# Patient Record
Sex: Male | Born: 1985 | Race: Black or African American | Hispanic: No | Marital: Married | Smoking: Former smoker
Health system: Southern US, Community
[De-identification: ages and names within clinical notes are randomized; demographics above are authoritative.]

## PROBLEM LIST (undated history)

## (undated) ENCOUNTER — Emergency Department (HOSPITAL_COMMUNITY): Admission: EM | Payer: Self-pay | Source: Home / Self Care

---

## 2012-11-20 ENCOUNTER — Emergency Department (HOSPITAL_COMMUNITY): Payer: Self-pay

## 2012-11-20 ENCOUNTER — Emergency Department (HOSPITAL_COMMUNITY)
Admission: EM | Admit: 2012-11-20 | Discharge: 2012-11-20 | Disposition: A | Discharge status: Home / Self Care | Payer: Self-pay | Attending: Emergency Medicine | Admitting: Emergency Medicine

## 2012-11-20 ENCOUNTER — Encounter (HOSPITAL_COMMUNITY): Payer: Self-pay | Admitting: Emergency Medicine

## 2012-11-20 DIAGNOSIS — M25561 Pain in right knee: Secondary | ICD-10-CM

## 2012-11-20 DIAGNOSIS — M25569 Pain in unspecified knee: Secondary | ICD-10-CM | POA: Insufficient documentation

## 2012-11-20 MED ORDER — TRAMADOL HCL 50 MG PO TABS: 50.0000 mg | ORAL_TABLET | Freq: Four times a day (QID) | ORAL | Status: DC | PRN

## 2012-11-20 NOTE — Progress Notes (Signed)
Orthopedic Tech Progress Note Patient Details:  Carl Davila October 06, 1985 [id number] Knee Immobilizer applied to Right LE. Crutches fit for height and comfort.  Ortho Devices Type of Ortho Device: Knee Immobilizer;Crutches Ortho Device/Splint Location: Right LE Ortho Device/Splint Interventions: Application   Asia R Thompson 11/20/2012, 11:02 AM

## 2012-11-20 NOTE — ED Provider Notes (Signed)
CSN: [id number]     Arrival date & time 11/20/12  4098 History   First MD Initiated Contact with Patient 11/20/12 [phone]     Chief Complaint  Patient presents with  . Knee Pain   (Consider location/radiation/quality/duration/timing/severity/associated sxs/prior Treatment) HPI Comments: Pt states that he has been having right knee pain intermittently but today it seems worse:pt state that he has not had swelling redness or warmth to the area:pt states that he took some tylenol with mild relief:pt states that the area doesn't want to bend completely:pt states that he feel on it this morning because of the pain  The history is provided by the patient. No language interpreter was used.    History reviewed. No pertinent past medical history. History reviewed. No pertinent past surgical history. History reviewed. No pertinent family history. History  Substance Use Topics  . Smoking status: Never Smoker   . Smokeless tobacco: Not on file  . Alcohol Use: No    Review of Systems  Constitutional: Negative.   Respiratory: Negative.   Cardiovascular: Negative.     Allergies  Review of patient's allergies indicates no known allergies.  Home Medications  No current outpatient prescriptions on file. BP 115/64  Pulse 62  Temp(Src) 98.2 F (36.8 C) (Oral)  Resp 18  SpO2 98% Physical Exam  Nursing note and vitals reviewed. Constitutional: He is oriented to person, place, and time. He appears well-developed and well-nourished.  Cardiovascular: Normal rate and regular rhythm.   Pulmonary/Chest: Effort normal.  Musculoskeletal: Normal range of motion.  No gross deformity or swelling noted:no redness or warmth:has full YNW:GNFAOZ to the  Lateral aspect  Neurological: He is alert and oriented to person, place, and time.  Skin: Skin is warm and dry.    ED Course  Procedures (including critical care time) Labs Review Labs Reviewed - No data to display Imaging Review Dg Knee Complete 4  Views Right  11/20/2012   CLINICAL DATA:  Right knee pain  EXAM: RIGHT KNEE - COMPLETE 4+ VIEW  COMPARISON:  None  FINDINGS: There is a large osteochondral defect involving the lateral femoral condyle. MRI of the right knee may be helpful to evaluate further. The joint spaces are relatively well-preserved. No acute fracture is seen and no joint effusion is noted. The patella appears normally positioned.  IMPRESSION: Large osteochondral defect involving the right lateral femoral condyle. Consider MRI to assess further.   Electronically Signed   By: Dwyane Dee M.D.   On: 11/20/2012 09:42    MDM   1. Knee pain, right    Discussed findings with pt:pt given ortho follow up:pt placed in immobilizer for comfort    Teressa Lower, NP 11/20/12 1016

## 2012-11-20 NOTE — ED Notes (Signed)
NP in room

## 2012-11-20 NOTE — ED Notes (Signed)
Spoke with Ortho will see patient shortly.

## 2012-11-20 NOTE — ED Notes (Addendum)
Pt states he has had right knee pain off an on since he was 76 or 27 years old.  Today he was walking to work and was unable to continue, and states that he hs pain in his knee in the front and back sides. Pain is worse when he extends his knee but at rest it is still 8/10.  Denies recent injuries.

## 2012-11-20 NOTE — ED Notes (Signed)
Paged Ortho  

## 2012-11-20 NOTE — ED Notes (Signed)
Pt c/o right knee pain from walking to work recently; pt denies obvious injury but sts some chronic pain

## 2012-11-20 NOTE — ED Provider Notes (Signed)
Medical screening examination/treatment/procedure(s) were performed by non-physician practitioner and as supervising physician I was immediately available for consultation/collaboration.    Celene Kras, MD 11/20/12 [phone]

## 2013-04-01 ENCOUNTER — Emergency Department (HOSPITAL_COMMUNITY)
Admission: EM | Admit: 2013-04-01 | Discharge: 2013-04-01 | Disposition: A | Discharge status: Home / Self Care | Payer: Self-pay | Attending: Emergency Medicine | Admitting: Emergency Medicine

## 2013-04-01 ENCOUNTER — Encounter (HOSPITAL_COMMUNITY): Payer: Self-pay | Admitting: Emergency Medicine

## 2013-04-01 ENCOUNTER — Emergency Department (HOSPITAL_COMMUNITY): Payer: Self-pay

## 2013-04-01 DIAGNOSIS — B349 Viral infection, unspecified: Secondary | ICD-10-CM

## 2013-04-01 DIAGNOSIS — B9789 Other viral agents as the cause of diseases classified elsewhere: Secondary | ICD-10-CM | POA: Insufficient documentation

## 2013-04-01 MED ORDER — DIPHENHYDRAMINE HCL 25 MG PO TABS: 25.0000 mg | ORAL_TABLET | Freq: Four times a day (QID) | ORAL | Status: DC | PRN

## 2013-04-01 MED ORDER — ACETAMINOPHEN 500 MG PO TABS
500.0000 mg | ORAL_TABLET | Freq: Four times a day (QID) | ORAL | Status: DC | PRN
Start: 2013-04-01 — End: 2013-05-17

## 2013-04-01 NOTE — Discharge Instructions (Signed)
Take tylenol as needed for body aches. Take benadryl as needed for congestion. Refer to attached documents for more information.

## 2013-04-01 NOTE — ED Provider Notes (Signed)
CSN: [id number]     Arrival date & time 04/01/13  1318 History  This chart was scribed for non-physician practitioner, Emilia Beck, PA-C working with Gavin Pound. Oletta Lamas, MD by Greggory Stallion, ED scribe. This patient was seen in room TR06C/TR06C and the patient's care was started at 3:12 PM.   Chief Complaint  Patient presents with  . Influenza   The history is provided by the patient. No language interpreter was used.   HPI Comments: Carl Davila is a 28 y.o. male who presents to the Emergency Department complaining of generalized body aches, rhinorrhea, headache and fever that started yesterday. He also has mild cough. Pt has taken an OTC sinus medication with no relief. Denies sore throat.   History reviewed. No pertinent past medical history. History reviewed. No pertinent past surgical history. No family history on file. History  Substance Use Topics  . Smoking status: Never Smoker   . Smokeless tobacco: Not on file  . Alcohol Use: No    Review of Systems  Constitutional: Positive for fever.  HENT: Positive for rhinorrhea. Negative for sore throat.   Respiratory: Positive for cough.   Musculoskeletal: Positive for myalgias.  Neurological: Positive for headaches.  All other systems reviewed and are negative.   Allergies  Review of patient's allergies indicates no known allergies.  Home Medications   Current Outpatient Rx  Name  Route  Sig  Dispense  Refill  . acetaminophen (TYLENOL) 500 MG tablet   Oral   Take 1,000 mg by mouth every 6 (six) hours as needed for mild pain.         Marland Kitchen OVER THE COUNTER MEDICATION   Oral   Take 1 tablet by mouth daily as needed (otc allergy relief).          BP 120/81  Pulse 67  Temp(Src) 97.6 F (36.4 C) (Oral)  Resp 18  SpO2 100%  Physical Exam  Nursing note and vitals reviewed. Constitutional: He is oriented to person, place, and time. He appears well-developed and well-nourished. No distress.  HENT:  Head:  Normocephalic and atraumatic.  Mouth/Throat: Oropharynx is clear and moist and mucous membranes are normal.  Eyes: EOM are normal.  Neck: Neck supple. No tracheal deviation present.  Cardiovascular: Normal rate, regular rhythm and normal heart sounds.   Pulmonary/Chest: Effort normal and breath sounds normal. No respiratory distress. He has no wheezes. He has no rales.  Musculoskeletal: Normal range of motion.  Neurological: He is alert and oriented to person, place, and time.  Skin: Skin is warm and dry.  Psychiatric: He has a normal mood and affect. His behavior is normal.    ED Course  Procedures (including critical care time)  DIAGNOSTIC STUDIES: Oxygen Saturation is 100% on RA, normal by my interpretation.    COORDINATION OF CARE: 3:14 PM-Discussed treatment plan which includes tylenol with pt at bedside and pt agreed to plan.   Labs Review Labs Reviewed - No data to display Imaging Review Dg Chest 2 View  04/01/2013   CLINICAL DATA:  Fever  EXAM: CHEST  2 VIEW  COMPARISON:  None.  FINDINGS: The heart size and mediastinal contours are within normal limits. Both lungs are clear. The visualized skeletal structures are unremarkable.  IMPRESSION: No active cardiopulmonary disease.   Electronically Signed   By: Alcide Clever M.D.   On: 04/01/2013 14:47    EKG Interpretation   None       MDM   Final diagnoses:  Viral illness  3:17 PM Chest xray unremarkable for acute changes. Patient will have tylenol and benadryl for symptoms. Vitals stable and patient afebrile. Patient likely has a viral illness.   I personally performed the services described in this documentation, which was scribed in my presence. The recorded information has been reviewed and is accurate.   Emilia BeckKaitlyn Emanuelle Bastos, PA-C 04/01/13 [phone]

## 2013-04-01 NOTE — ED Notes (Signed)
C/o headache, body aches--onset yesterday.

## 2013-04-01 NOTE — ED Notes (Signed)
Pt states started feeling bad on Sunday and today started feeling bad.  Pt reports body aches, headache, fever.

## 2013-04-10 NOTE — ED Provider Notes (Signed)
Medical screening examination/treatment/procedure(s) were performed by non-physician practitioner and as supervising physician I was immediately available for consultation/collaboration.   Gavin PoundMichael Y. Oletta LamasGhim, MD 04/10/13 [phone]

## 2013-05-17 ENCOUNTER — Encounter (HOSPITAL_COMMUNITY): Payer: Self-pay | Admitting: Emergency Medicine

## 2013-05-17 ENCOUNTER — Emergency Department (HOSPITAL_COMMUNITY)
Admission: EM | Admit: 2013-05-17 | Discharge: 2013-05-17 | Disposition: A | Discharge status: Home / Self Care | Payer: Self-pay | Attending: Emergency Medicine | Admitting: Emergency Medicine

## 2013-05-17 DIAGNOSIS — R040 Epistaxis: Secondary | ICD-10-CM

## 2013-05-17 DIAGNOSIS — R42 Dizziness and giddiness: Secondary | ICD-10-CM | POA: Insufficient documentation

## 2013-05-17 NOTE — ED Provider Notes (Signed)
Medical screening examination/treatment/procedure(s) were performed by non-physician practitioner and as supervising physician I was immediately available for consultation/collaboration.   EKG Interpretation None        [name], MD 05/17/13 1336

## 2013-05-17 NOTE — ED Notes (Signed)
Patient presents stating he woke up this morning with the right side of his nose bleeding.  Held pressure to the nose and bleeding stopped.  No c/o dizziness.

## 2013-05-17 NOTE — Discharge Instructions (Signed)
You were seen for your episode of nose bleeding.  At this time your provider(s) do not feel your bleeding was caused by any concerning or emergent condition.  If your nose bleeds again, blow out all blood clots and hold pressure by pinching for 10-15 minutes.  Follow up with a primary care provider for continued evaluation and treatment.    Nosebleed Nosebleeds can be caused by many conditions including trauma, infections, polyps, foreign bodies, dry mucous membranes or climate, medications and air conditioning. Most nosebleeds occur in the front of the nose. It is because of this location that most nosebleeds can be controlled by pinching the nostrils gently and continuously. Do this for at least 10 to 20 minutes. The reason for this long continuous pressure is that you must hold it long enough for the blood to clot. If during that 10 to 20 minute time period, pressure is released, the process may have to be started again. The nosebleed may stop by itself, quit with pressure, need concentrated heating (cautery) or stop with pressure from packing. HOME CARE INSTRUCTIONS   If your nose was packed, try to maintain the pack inside until your caregiver removes it. If a gauze pack was used and it starts to fall out, gently replace or cut the end off. Do not cut if a balloon catheter was used to pack the nose. Otherwise, do not remove unless instructed.  Avoid blowing your nose for 12 hours after treatment. This could dislodge the pack or clot and start bleeding again.  If the bleeding starts again, sit up and bending forward, gently pinch the front half of your nose continuously for 20 minutes.  If bleeding was caused by dry mucous membranes, cover the inside of your nose every morning with a petroleum or antibiotic ointment. Use your little fingertip as an applicator. Do this as needed during dry weather. This will keep the mucous membranes moist and allow them to heal.  Maintain humidity in your home by  using less air conditioning or using a humidifier.  Do not use aspirin or medications which make bleeding more likely. Your caregiver can give you recommendations on this.  Resume normal activities as able but try to avoid straining, lifting or bending at the waist for several days.  If the nosebleeds become recurrent and the cause is unknown, your caregiver may suggest laboratory tests. SEEK IMMEDIATE MEDICAL CARE IF:   Bleeding recurs and cannot be controlled.  There is unusual bleeding from or bruising on other parts of the body.  You have a fever.  Nosebleeds continue.  There is any worsening of the condition which originally brought you in.  You become lightheaded, feel faint, become sweaty or vomit blood. MAKE SURE YOU:   Understand these instructions.  Will watch your condition.  Will get help right away if you are not doing well or get worse. Document Released: 11/10/2004 Document Revised: 04/25/2011 Document Reviewed: 01/02/2009 [facility] Patient Information 2014 [employer], [place].

## 2013-05-17 NOTE — ED Provider Notes (Signed)
CSN: [id number]     Arrival date & time 05/17/13  0246 History   First MD Initiated Contact with Patient 05/17/13 [phone]     Chief Complaint  Patient presents with  . Epistaxis   HPI  History provided by the patient. Patient is a 28 year old male with no significant PMH presenting with concerns for a right nosebleed. Patient states he woke up this morning feeling slightly funny and dizzy with a right nosebleed. He reports grabbing a sheet and pinching his nose for several minutes and bleeding did stop. He believes he may have had bleeding for 10 minutes. The patient denies any trauma or injury to the area. Does state he has had some rhinorrhea recently but he attributes to allergies. He denies any vigorous blowing of his nose sneezing prior to symptoms. Denied having any bleeding gums throat. Patient currently states he is feeling much better with no complaints. There has not been any rebleeding of the area. He has not taken any medications. Does not have any history of bleeding disorders.   History reviewed. No pertinent past medical history. History reviewed. No pertinent past surgical history. No family history on file. History  Substance Use Topics  . Smoking status: Never Smoker   . Smokeless tobacco: Not on file  . Alcohol Use: No    Review of Systems  Constitutional: Negative for fever.  HENT: Positive for rhinorrhea. Negative for sneezing.   Respiratory: Negative for cough.   All other systems reviewed and are negative.      Allergies  Review of patient's allergies indicates no known allergies.  Home Medications   Current Outpatient Rx  Name  Route  Sig  Dispense  Refill  . acetaminophen (TYLENOL) 500 MG tablet   Oral   Take 1,000 mg by mouth every 6 (six) hours as needed for mild pain.         Marland Kitchen. acetaminophen (TYLENOL) 500 MG tablet   Oral   Take 1 tablet (500 mg total) by mouth every 6 (six) hours as needed.   30 tablet   0   . diphenhydrAMINE (BENADRYL) 25 MG  tablet   Oral   Take 1 tablet (25 mg total) by mouth every 6 (six) hours as needed for itching (Rash).   30 tablet   0   . OVER THE COUNTER MEDICATION   Oral   Take 1 tablet by mouth daily as needed (otc allergy relief).          BP 125/79  Pulse 67  Temp(Src) 97.8 F (36.6 C) (Oral)  Resp 14  Ht 5\' 8"  (1.727 m)  Wt 119 lb (53.978 kg)  BMI 18.10 kg/m2  SpO2 99% Physical Exam  Nursing note and vitals reviewed. Constitutional: He is oriented to person, place, and time. He appears well-developed and well-nourished.  HENT:  Head: Normocephalic.  Mouth/Throat: Oropharynx is clear and moist.  Small blood clot present the right nasal septum and Kiesselbach area. No active bleeding. No polyps or other concerning findings on exam.  Cardiovascular: Normal rate and regular rhythm.   Pulmonary/Chest: Effort normal and breath sounds normal. No respiratory distress.  Neurological: He is alert and oriented to person, place, and time.  Psychiatric: He has a normal mood and affect. His behavior is normal.    ED Course  Procedures   COORDINATION OF CARE:  Nursing notes reviewed. Vital signs reviewed. Initial pt interview and examination performed.   4:45 AM-patient seen and evaluated. Patient well-appearing no acute distress. No  active nosebleed. Small blood clot to the right nasal septal area. No other concerning findings on exam. Patient without any bleeding disorders noninvolved in her medications. At this time he is instructed on treatment and care for nosebleed and may be discharged home.    MDM   Final diagnoses:  Right-sided epistaxis        Angus Seller, PA-C 05/17/13 [phone]

## 2013-05-17 NOTE — ED Notes (Signed)
Pt. reports epistaxis at right nare  onset this evening , denies injury , respirations unlabored , no bleeding at arrival.

## 2014-04-15 ENCOUNTER — Encounter (HOSPITAL_COMMUNITY): Payer: Self-pay | Admitting: Emergency Medicine

## 2014-04-15 ENCOUNTER — Emergency Department (HOSPITAL_COMMUNITY)
Admission: EM | Admit: 2014-04-15 | Discharge: 2014-04-15 | Disposition: A | Discharge status: Home / Self Care | Payer: Self-pay | Attending: Emergency Medicine | Admitting: Emergency Medicine

## 2014-04-15 DIAGNOSIS — Y990 Civilian activity done for income or pay: Secondary | ICD-10-CM | POA: Insufficient documentation

## 2014-04-15 DIAGNOSIS — Y9289 Other specified places as the place of occurrence of the external cause: Secondary | ICD-10-CM | POA: Insufficient documentation

## 2014-04-15 DIAGNOSIS — S4991XA Unspecified injury of right shoulder and upper arm, initial encounter: Secondary | ICD-10-CM | POA: Insufficient documentation

## 2014-04-15 DIAGNOSIS — S29012A Strain of muscle and tendon of back wall of thorax, initial encounter: Secondary | ICD-10-CM | POA: Insufficient documentation

## 2014-04-15 DIAGNOSIS — Y9389 Activity, other specified: Secondary | ICD-10-CM | POA: Insufficient documentation

## 2014-04-15 DIAGNOSIS — Z87891 Personal history of nicotine dependence: Secondary | ICD-10-CM | POA: Insufficient documentation

## 2014-04-15 DIAGNOSIS — S39012A Strain of muscle, fascia and tendon of lower back, initial encounter: Secondary | ICD-10-CM

## 2014-04-15 DIAGNOSIS — X58XXXA Exposure to other specified factors, initial encounter: Secondary | ICD-10-CM | POA: Insufficient documentation

## 2014-04-15 MED ORDER — OXYCODONE-ACETAMINOPHEN 5-325 MG PO TABS: 1.0000 | ORAL_TABLET | Freq: Four times a day (QID) | ORAL | Status: DC | PRN

## 2014-04-15 MED ORDER — CYCLOBENZAPRINE HCL 10 MG PO TABS: 10.0000 mg | ORAL_TABLET | Freq: Two times a day (BID) | ORAL | Status: DC | PRN

## 2014-04-15 NOTE — ED Provider Notes (Signed)
CSN: [id number]     Arrival date & time 04/15/14  1534 History   First MD Initiated Contact with Patient 04/15/14 1558     Chief Complaint  Patient presents with  . Neck Pain     (Consider location/radiation/quality/duration/timing/severity/associated sxs/prior Treatment) Patient is a 29 y.o. male presenting with neck pain. The history is provided by the patient.  Neck Pain Pain location:  L side and R side Quality:  Aching Radiates to: back and left chest. Pain severity:  Mild Pain is:  Same all the time Onset quality:  Gradual Duration:  3 days Timing:  Constant Progression:  Unchanged Chronicity:  New Context comment:  Loading watermelons Relieved by:  Nothing Worsened by:  Position and bending Ineffective treatments:  NSAIDs Associated symptoms: no chest pain, no fever, no headaches and no numbness     History reviewed. No pertinent past medical history. History reviewed. No pertinent past surgical history. No family history on file. History  Substance Use Topics  . Smoking status: Former Games developermoker  . Smokeless tobacco: Not on file  . Alcohol Use: No    Review of Systems  Constitutional: Negative for fever.  HENT: Negative for drooling and rhinorrhea.   Eyes: Negative for pain.  Respiratory: Negative for cough and shortness of breath.   Cardiovascular: Negative for chest pain and leg swelling.  Gastrointestinal: Negative for nausea, vomiting, abdominal pain and diarrhea.  Genitourinary: Negative for dysuria and hematuria.  Musculoskeletal: Positive for back pain. Negative for gait problem and neck pain.  Skin: Negative for color change.  Neurological: Negative for numbness and headaches.  Hematological: Negative for adenopathy.  Psychiatric/Behavioral: Negative for behavioral problems.  All other systems reviewed and are negative.     Allergies  Review of patient's allergies indicates no known allergies.  Home Medications   Prior to Admission medications    Medication Sig Start Date End Date Taking? Authorizing Provider  diphenhydrAMINE (BENADRYL) 25 MG tablet Take 1 tablet (25 mg total) by mouth every 6 (six) hours as needed for itching (Rash). 04/01/13   Kaitlyn Szekalski, PA-C   BP 114/77 mmHg  Pulse 86  Temp(Src) 98.3 F (36.8 C) (Oral)  Resp 20  Ht 5\' 8"  (1.727 m)  Wt 130 lb (58.968 kg)  BMI 19.77 kg/m2  SpO2 98% Physical Exam  Constitutional: He is oriented to person, place, and time. He appears well-developed and well-nourished.  HENT:  Head: Normocephalic and atraumatic.  Right Ear: External ear normal.  Left Ear: External ear normal.  Nose: Nose normal.  Mouth/Throat: Oropharynx is clear and moist. No oropharyngeal exudate.  Eyes: Conjunctivae and EOM are normal. Pupils are equal, round, and reactive to light.  Neck: Normal range of motion. Neck supple.  No vertebral tenderness noted.  Cardiovascular: Normal rate, regular rhythm, normal heart sounds and intact distal pulses.  Exam reveals no gallop and no friction rub.   No murmur heard. Pulmonary/Chest: Effort normal and breath sounds normal. No respiratory distress. He has no wheezes.  Abdominal: Soft. Bowel sounds are normal. He exhibits no distension. There is no tenderness. There is no rebound and no guarding.  Musculoskeletal: Normal range of motion. He exhibits tenderness. He exhibits no edema.  Pain reproduced with twisting of the torso or bending of the back.  Focal tenderness of the lower cervical paraspinal area bilaterally.  Mild pain with range of motion of the right shoulder. Although normal range of motion of both shoulders is noted.  Normal strength and sensation in upper and  lower extremities.  2+ distal pulses in the upper extremities.  Neurological: He is alert and oriented to person, place, and time.  Skin: Skin is warm and dry.  Psychiatric: He has a normal mood and affect. His behavior is normal.  Nursing note and vitals reviewed.   ED Course   Procedures (including critical care time) Labs Review Labs Reviewed - No data to display  Imaging Review No results found.   EKG Interpretation None      MDM   Final diagnoses:  Back strain, initial encounter    4:10 PM 29 y.o. male who presents with upper back and neck pain which began 3 days ago. 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 He states that he had no specific injury but awoke Saturday afternoon with upper back and neck stiffness and pain. He has used extra strength Tylenol without significant relief. He is afebrile and vital signs are unremarkable here. He has no midline vertebral tenderness on exam. No back pain red flags. He does note some paresthesias intermittently in his hands but denies this currently on exam. We'll treat for a back strain.  4:11 PM:  I have discussed the diagnosis/risks/treatment options with the patient and believe the pt to be eligible for discharge home to follow-up with a pcp as needed. We also discussed returning to the ED immediately if new or worsening sx occur. We discussed the sx which are most concerning (e.g., worsening pain, weakness, numbness) that necessitate immediate return. Medications administered to the patient during their visit and any new prescriptions provided to the patient are listed below.  Medications given during this visit Medications - No data to display  New Prescriptions   CYCLOBENZAPRINE (FLEXERIL) 10 MG TABLET    Take 1 tablet (10 mg total) by mouth 2 (two) times daily as needed for muscle spasms.   OXYCODONE-ACETAMINOPHEN (PERCOCET) 5-325 MG PER TABLET    Take 1-2 tablets by mouth every 6 (six) hours as needed for moderate pain.       Purvis Sheffield, MD 04/15/14 [phone]

## 2014-04-15 NOTE — Discharge Instructions (Signed)
Back Pain, Adult °Low back pain is very common. About 1 in 5 people have back pain. The cause of low back pain is rarely dangerous. The pain often gets better over time. About half of people with a sudden onset of back pain feel better in just 2 weeks. About 8 in 10 people feel better by 6 weeks.  °CAUSES °Some common causes of back pain include: °· Strain of the muscles or ligaments supporting the spine. °· Wear and tear (degeneration) of the spinal discs. °· Arthritis. °· Direct injury to the back. °DIAGNOSIS °Most of the time, the direct cause of low back pain is not known. However, back pain can be treated effectively even when the exact cause of the pain is unknown. Answering your caregiver's questions about your overall health and symptoms is one of the most accurate ways to make sure the cause of your pain is not dangerous. If your caregiver needs more information, he or she may order lab work or imaging tests (X-rays or MRIs). However, even if imaging tests show changes in your back, this usually does not require surgery. °HOME CARE INSTRUCTIONS °For many people, back pain returns. Since low back pain is rarely dangerous, it is often a condition that people can learn to manage on their own.  °· Remain active. It is stressful on the back to sit or stand in one place. Do not sit, drive, or stand in one place for more than 30 minutes at a time. Take short walks on level surfaces as soon as pain allows. Try to increase the length of time you walk each day. °· Do not stay in bed. Resting more than 1 or 2 days can delay your recovery. °· Do not avoid exercise or work. Your body is made to move. It is not dangerous to be active, even though your back may hurt. Your back will likely heal faster if you return to being active before your pain is gone. °· Pay attention to your body when you  bend and lift. Many people have less discomfort when lifting if they bend their knees, keep the load close to their bodies, and  avoid twisting. Often, the most comfortable positions are those that put less stress on your recovering back. °· Find a comfortable position to sleep. Use a firm mattress and lie on your side with your knees slightly bent. If you lie on your back, put a pillow under your knees. °· Only take over-the-counter or prescription medicines as directed by your caregiver. Over-the-counter medicines to reduce pain and inflammation are often the most helpful. Your caregiver may prescribe muscle relaxant drugs. These medicines help dull your pain so you can more quickly return to your normal activities and healthy exercise. °· Put ice on the injured area. °¨ Put ice in a plastic bag. °¨ Place a towel between your skin and the bag. °¨ Leave the ice on for 15-20 minutes, 03-04 times a day for the first 2 to 3 days. After that, ice and heat may be alternated to reduce pain and spasms. °· Ask your caregiver about trying back exercises and gentle massage. This may be of some benefit. °· Avoid feeling anxious or stressed. Stress increases muscle tension and can worsen back pain. It is important to recognize when you are anxious or stressed and learn ways to manage it. Exercise is a great option. °SEEK MEDICAL CARE IF: °· You have pain that is not relieved with rest or medicine. °· You have pain that does not improve in 1 week. °· You have new symptoms. °· You are generally not feeling well. °SEEK   IMMEDIATE MEDICAL CARE IF:   You have pain that radiates from your back into your legs.  You develop new bowel or bladder control problems.  You have unusual weakness or numbness in your arms or legs.  You develop nausea or vomiting.  You develop abdominal pain.  You feel faint. Document Released: 01/31/2005 Document Revised: 08/02/2011 Document Reviewed: 06/04/2013 [facility] Patient Information 2015 [place], [place]. This information is not intended to replace advice given to you by your health care provider. Make sure you  discuss any questions you have with your health care provider.  After you run out of Percocet please take Tylenol or ibuprofen 600 mg every 6 hours for 3-4 more days.

## 2014-04-15 NOTE — ED Notes (Signed)
Pt c/o pain in left side of neck and upper back pain x's 4 days.  St's he has been taking OTC meds without relief

## 2014-06-02 ENCOUNTER — Encounter (HOSPITAL_COMMUNITY): Payer: Self-pay | Admitting: Emergency Medicine

## 2014-06-02 ENCOUNTER — Emergency Department (HOSPITAL_COMMUNITY)
Admission: EM | Admit: 2014-06-02 | Discharge: 2014-06-02 | Disposition: A | Discharge status: Home / Self Care | Payer: Self-pay | Attending: Emergency Medicine | Admitting: Emergency Medicine

## 2014-06-02 DIAGNOSIS — J039 Acute tonsillitis, unspecified: Secondary | ICD-10-CM | POA: Insufficient documentation

## 2014-06-02 DIAGNOSIS — Z87891 Personal history of nicotine dependence: Secondary | ICD-10-CM | POA: Insufficient documentation

## 2014-06-02 DIAGNOSIS — Z79899 Other long term (current) drug therapy: Secondary | ICD-10-CM | POA: Insufficient documentation

## 2014-06-02 LAB — RAPID STREP SCREEN (MED CTR MEBANE ONLY): Streptococcus, Group A Screen (Direct): NEGATIVE

## 2014-06-02 MED ORDER — PENICILLIN V POTASSIUM 500 MG PO TABS: 500.0000 mg | ORAL_TABLET | Freq: Three times a day (TID) | ORAL | Status: DC

## 2014-06-02 MED ORDER — ACETAMINOPHEN 325 MG PO TABS
ORAL_TABLET | ORAL | Status: AC
  Filled 2014-06-02: qty 1

## 2014-06-02 MED ORDER — ACETAMINOPHEN 325 MG PO TABS
650.0000 mg | ORAL_TABLET | Freq: Four times a day (QID) | ORAL | Status: DC | PRN
  Administered 2014-06-02 (×2): 650 mg via ORAL
  Filled 2014-06-02: qty 2

## 2014-06-02 NOTE — ED Notes (Signed)
Pt sts fever and body aches; pt unsure of last time took tylenol

## 2014-06-02 NOTE — ED Notes (Signed)
Declined W/C at D/C and was escorted to lobby by RN. 

## 2014-06-02 NOTE — Discharge Instructions (Signed)

## 2014-06-02 NOTE — ED Provider Notes (Signed)
CSN: [id number]     Arrival date & time 06/02/14  1251 History  This chart was scribed for non-physician practitioner, Marlon Peliffany Berklie Dethlefs, PA-C, working with Lorre NickAnthony Allen, MD, by Ronney LionSuzanne Le, ED Scribe. This patient was seen in room TR07C/TR07C and the patient's care was started at 3:41 PM.    Chief Complaint  Patient presents with  . Fever  . Generalized Body Aches    The history is provided by the patient. No language interpreter was used.   HPI Comments: Carl Davila is a 29 y.o. male with no significant PMHx who presents to the Emergency Department complaining of fever that was 102 this morning, generalized myalgias, sore throat, and otalgia that began this morning. He also complains of associated dizziness, headache (resolved), and diarrhea (resolved). Patient states his symptoms were severe when he woke up this morning and gradually improved through the day. He was last well yesterday. He denies cough, neck pain, nausea, or abdominal pain.   History reviewed. No pertinent past medical history. History reviewed. No pertinent past surgical history. History reviewed. No pertinent family history. History  Substance Use Topics  . Smoking status: Former Games developermoker  . Smokeless tobacco: Not on file  . Alcohol Use: No    Review of Systems A complete 10 system review of systems was obtained and all systems are negative except as noted in the HPI and PMH.     Allergies  Review of patient's allergies indicates no known allergies.  Home Medications   Prior to Admission medications   Medication Sig Start Date End Date Taking? Authorizing Provider  cyclobenzaprine (FLEXERIL) 10 MG tablet Take 1 tablet (10 mg total) by mouth 2 (two) times daily as needed for muscle spasms. 04/15/14   Purvis SheffieldForrest Harrison, MD  diphenhydrAMINE (BENADRYL) 25 MG tablet Take 1 tablet (25 mg total) by mouth every 6 (six) hours as needed for itching (Rash). 04/01/13   Emilia BeckKaitlyn Szekalski, PA-C  oxyCODONE-acetaminophen  (PERCOCET) 5-325 MG per tablet Take 1-2 tablets by mouth every 6 (six) hours as needed for moderate pain. 04/15/14   Purvis SheffieldForrest Harrison, MD   BP 105/88 mmHg  Pulse 106  Temp(Src) 101.7 F (38.7 C) (Oral)  Resp 18  Wt 120 lb 8 oz (54.658 kg)  SpO2 97% Physical Exam  Constitutional: He is oriented to person, place, and time. He appears well-developed and well-nourished. No distress.  HENT:  Head: Normocephalic and atraumatic.  Right Ear: Tympanic membrane, external ear and ear canal normal.  Left Ear: Tympanic membrane, external ear and ear canal normal.  Nose: Nose normal. No rhinorrhea. Right sinus exhibits no maxillary sinus tenderness and no frontal sinus tenderness. Left sinus exhibits no maxillary sinus tenderness and no frontal sinus tenderness.  Mouth/Throat: Uvula is midline and mucous membranes are normal. No trismus in the jaw. Normal dentition. No dental abscesses or uvula swelling. Posterior oropharyngeal edema and posterior oropharyngeal erythema present. No oropharyngeal exudate or tonsillar abscesses.  No submental edema, tongue not elevated, no trismus. No impending airway obstruction; Pt able to speak full sentences, swallow intact, no drooling, stridor, or tonsillar/uvula displacement. No palatal petechia  Eyes: Conjunctivae and EOM are normal.  Neck: Trachea normal, normal range of motion and full passive range of motion without pain. Neck supple. No rigidity. No tracheal deviation and normal range of motion present. No Brudzinski's sign noted.  Flexion and extension of neck without pain or difficulty. Able to breath without difficulty in extension.  Cardiovascular: Normal rate and regular rhythm.   Pulmonary/Chest:  Effort normal and breath sounds normal. No stridor. No respiratory distress. He has no wheezes.  Abdominal: Soft. There is no tenderness.  No obvious evidence of splenomegaly. Non ttp.   Musculoskeletal: Normal range of motion.  Lymphadenopathy:       Head (right  side): No preauricular and no posterior auricular adenopathy present.       Head (left side): No preauricular and no posterior auricular adenopathy present.    He has cervical adenopathy.  Neurological: He is alert and oriented to person, place, and time.  Skin: Skin is warm and dry. No rash noted. He is not diaphoretic.  Psychiatric: He has a normal mood and affect. His behavior is normal.  Nursing note and vitals reviewed.   ED Course  Procedures (including critical care time)  DIAGNOSTIC STUDIES: Oxygen Saturation is 97% on room air, normal by my interpretation.    COORDINATION OF CARE: 3:43 PM - Patient's fever has resolved after administering Tylenol here, 98.6 as checked by myself while in room with patient.  Labs Review  Results for orders placed or performed during the hospital encounter of 06/02/14  Rapid strep screen  Result Value Ref Range   Streptococcus, Group A Screen (Direct) NEGATIVE NEGATIVE     MDM   Final diagnoses:  None   Will treat with Tylenol and rx penicillin due to fever and physical exam. His strep screen was negative.  Medications  acetaminophen (TYLENOL) tablet 650 mg (650 mg Oral Given 06/02/14 1451)  acetaminophen (TYLENOL) 325 MG tablet (not administered)    28 y.o.Carl Davila's evaluation in the Emergency Department is complete. It has been determined that no acute conditions requiring further emergency intervention are present at this time. The patient/guardian have been advised of the diagnosis and plan. We have discussed signs and symptoms that warrant return to the ED, such as changes or worsening in symptoms.  Vital signs are stable at discharge. Filed Vitals:   06/02/14 1552  BP: 104/61  Pulse: 95  Temp:   Resp: 22    Patient/guardian has voiced understanding and agreed to follow-up with the PCP or specialist.   I personally performed the services described in this documentation, which was scribed in my presence. The  recorded information has been reviewed and is accurate.     Marlon Pel, PA-C 06/04/14 4132  Lorre Nick, MD 06/04/14 1010

## 2014-06-04 LAB — CULTURE, GROUP A STREP: Strep A Culture: NEGATIVE

## 2014-10-24 ENCOUNTER — Emergency Department (HOSPITAL_COMMUNITY)
Admission: EM | Admit: 2014-10-24 | Discharge: 2014-10-24 | Disposition: A | Discharge status: Home / Self Care | Payer: Self-pay | Attending: Emergency Medicine | Admitting: Emergency Medicine

## 2014-10-24 ENCOUNTER — Encounter (HOSPITAL_COMMUNITY): Payer: Self-pay | Admitting: *Deleted

## 2014-10-24 DIAGNOSIS — K0381 Cracked tooth: Secondary | ICD-10-CM | POA: Insufficient documentation

## 2014-10-24 DIAGNOSIS — K088 Other specified disorders of teeth and supporting structures: Secondary | ICD-10-CM | POA: Insufficient documentation

## 2014-10-24 DIAGNOSIS — K0889 Other specified disorders of teeth and supporting structures: Secondary | ICD-10-CM

## 2014-10-24 DIAGNOSIS — Z792 Long term (current) use of antibiotics: Secondary | ICD-10-CM | POA: Insufficient documentation

## 2014-10-24 DIAGNOSIS — Z87891 Personal history of nicotine dependence: Secondary | ICD-10-CM | POA: Insufficient documentation

## 2014-10-24 DIAGNOSIS — H9201 Otalgia, right ear: Secondary | ICD-10-CM | POA: Insufficient documentation

## 2014-10-24 DIAGNOSIS — R51 Headache: Secondary | ICD-10-CM | POA: Insufficient documentation

## 2014-10-24 MED ORDER — IBUPROFEN 800 MG PO TABS: 800.0000 mg | ORAL_TABLET | Freq: Three times a day (TID) | ORAL | Status: DC | PRN

## 2014-10-24 MED ORDER — KETOROLAC TROMETHAMINE 60 MG/2ML IM SOLN
60.0000 mg | Freq: Once | INTRAMUSCULAR | Status: AC
  Administered 2014-10-24: 60 mg via INTRAMUSCULAR
  Filled 2014-10-24: qty 2

## 2014-10-24 MED ORDER — HYDROCODONE-ACETAMINOPHEN 5-325 MG PO TABS
2.0000 | ORAL_TABLET | Freq: Once | ORAL | Status: AC
  Administered 2014-10-24: 2 via ORAL
  Filled 2014-10-24: qty 2

## 2014-10-24 NOTE — ED Provider Notes (Signed)
CSN: [id number]     Arrival date & time 10/24/14  0102 History   This chart was scribed for Tomasita Crumble, MD by Evon Slack, ED Scribe. This patient was seen in room A05C/A05C and the patient's care was started at 3:37 AM.   Chief Complaint  Patient presents with  . Dental Pain   Patient is a 29 y.o. male presenting with tooth pain. The history is provided by the patient. No language interpreter was used.  Dental Pain Location:  Lower Severity:  Moderate Duration:  1 day Chronicity:  New Context: dental fracture   Relieved by:  Nothing Ineffective treatments:  Acetaminophen Associated symptoms: headaches   Associated symptoms: no difficulty swallowing    HPI Comments: Carl Davila is a 30 y.o. male who presents to the Emergency Department complaining of lower right sided dental pain onset yesterday. Pt states that he has a tooth that chipped. Pt states that he has associated HA and right ear pain. Pt states that he has tried tylenol with no relief. Pt doesn't report any other symptoms.   History reviewed. No pertinent past medical history. History reviewed. No pertinent past surgical history. No family history on file. Social History  Substance Use Topics  . Smoking status: Former Games developer  . Smokeless tobacco: None  . Alcohol Use: No    Review of Systems  HENT: Positive for dental problem and ear pain.   Neurological: Positive for headaches.  All other systems reviewed and are negative.     Allergies  Review of patient's allergies indicates no known allergies.  Home Medications   Prior to Admission medications   Medication Sig Start Date End Date Taking? Authorizing Provider  cyclobenzaprine (FLEXERIL) 10 MG tablet Take 1 tablet (10 mg total) by mouth 2 (two) times daily as needed for muscle spasms. 04/15/14   Purvis Sheffield, MD  diphenhydrAMINE (BENADRYL) 25 MG tablet Take 1 tablet (25 mg total) by mouth every 6 (six) hours as needed for itching (Rash). 04/01/13    Emilia Beck, PA-C  oxyCODONE-acetaminophen (PERCOCET) 5-325 MG per tablet Take 1-2 tablets by mouth every 6 (six) hours as needed for moderate pain. 04/15/14   Purvis Sheffield, MD  penicillin v potassium (VEETID) 500 MG tablet Take 1 tablet (500 mg total) by mouth 3 (three) times daily. 06/02/14   Tiffany Neva Seat, PA-C   BP 126/87 mmHg  Pulse 81  Temp(Src) 98 F (36.7 C) (Oral)  Resp 18  Ht 5\' 9"  (1.753 m)  Wt 120 lb (54.432 kg)  BMI 17.71 kg/m2  SpO2 98%   Physical Exam  Constitutional: He is oriented to person, place, and time. Vital signs are normal. He appears well-developed and well-nourished.  Non-toxic appearance. He does not appear ill. No distress.  HENT:  Head: Normocephalic and atraumatic.  Nose: Nose normal.  Mouth/Throat: Oropharynx is clear and moist. No oropharyngeal exudate.  Tooth #19 half of the crown is broken off, no pulp exposure, not TTP, No swelling or significant signs of infection.   Eyes: Conjunctivae and EOM are normal. Pupils are equal, round, and reactive to light. No scleral icterus.  Neck: Normal range of motion. Neck supple. No tracheal deviation, no edema, no erythema and normal range of motion present. No thyroid mass and no thyromegaly present.  Cardiovascular: Normal rate, regular rhythm, S1 normal, S2 normal, normal heart sounds, intact distal pulses and normal pulses.  Exam reveals no gallop and no friction rub.   No murmur heard. Pulses:  Radial pulses are 2+ on the right side, and 2+ on the left side.       Dorsalis pedis pulses are 2+ on the right side, and 2+ on the left side.  Pulmonary/Chest: Effort normal and breath sounds normal. No respiratory distress. He has no wheezes. He has no rhonchi. He has no rales.  Abdominal: Soft. Normal appearance and bowel sounds are normal. He exhibits no distension, no ascites and no mass. There is no hepatosplenomegaly. There is no tenderness. There is no rebound, no guarding and no CVA tenderness.   Musculoskeletal: Normal range of motion. He exhibits no edema or tenderness.  Lymphadenopathy:    He has no cervical adenopathy.  Neurological: He is alert and oriented to person, place, and time. He has normal strength. No cranial nerve deficit or sensory deficit.  Skin: Skin is warm, dry and intact. No petechiae and no rash noted. He is not diaphoretic. No erythema. No pallor.  Psychiatric: He has a normal mood and affect. His behavior is normal. Judgment normal.  Nursing note and vitals reviewed.   ED Course  Procedures (including critical care time) DIAGNOSTIC STUDIES: Oxygen Saturation is 98% on RA, normal by my interpretation.    COORDINATION OF CARE: 4:00 AM-Discussed treatment plan with pt at bedside and pt agreed to plan.     Labs Review Labs Reviewed - No data to display  Imaging Review No results found.    EKG Interpretation None      MDM   Final diagnoses:  None    Patient presents to the ED for dental pain.  He has taken tylenol without any relief.  He was given toradol and norco in the ED.  Will be sent home with ibuprofen to take as well as dental follow up.  There are no signs of infection.  He otherwise appears well and in NAD, his VS remain within his normal limits and he is safe for DC.  I personally performed the services described in this documentation, which was scribed in my presence. The recorded information has been reviewed and is accurate.      Tomasita Crumble, MD 10/24/14 [phone]

## 2014-10-24 NOTE — ED Notes (Signed)
Pt verbalized understanding of d/c instructions and has no further questions. Pt stable and NAD. Pt given follow up with emergency dental clinic.

## 2014-10-24 NOTE — ED Notes (Signed)
Pt c/o right sided dental pain. States he has a broken tooth.

## 2014-10-24 NOTE — Discharge Instructions (Signed)
Dental Fracture Ms. Fortin, see a dentist within 3 days for close follow up.  Take ibuprofen as needed for pain control.  Come back to the ED for any swelling or worsening.  Thank you. You have a dental fracture or injury. This can mean the tooth is loose, has a chip in the enamel or is broken. If just the outer enamel is chipped, there is a good chance the tooth will not become infected. The only treatment needed may be to smooth off a rough edge. Fractures into the deeper layers (dentin and pulp) cause greater pain and are more likely to become infected. These require you to see a dentist as soon as possible to save the tooth. Loose teeth may need to be wired or bonded with a plastic splint to hold them in place. A paste may be painted on the open area of the broken tooth to reduce the pain. Antibiotics and pain medicine may be prescribed. Choosing a soft or liquid diet and rinsing the mouth out with warm water after meals may be helpful. See your dentist as recommended. Failure to seek care or follow up with a dentist or other specialist as recommended could result in the loss of your tooth, infection, or permanent dental problems. SEEK MEDICAL CARE IF:   You have increased pain not controlled with medicines.  You have swelling around the tooth, in the face or neck.  You have bleeding which starts, continues, or gets worse.  You have a fever. Document Released: 03/10/2004 Document Revised: 04/25/2011 Document Reviewed: 12/23/2008 [facility] Patient Information 2015 [place], [place]. This information is not intended to replace advice given to you by your health care provider. Make sure you discuss any questions you have with your health care provider.

## 2015-04-22 ENCOUNTER — Encounter (HOSPITAL_COMMUNITY): Payer: Self-pay | Admitting: Vascular Surgery

## 2015-04-22 ENCOUNTER — Emergency Department (HOSPITAL_COMMUNITY)
Admission: EM | Admit: 2015-04-22 | Discharge: 2015-04-22 | Disposition: A | Discharge status: Home / Self Care | Payer: Self-pay | Attending: Emergency Medicine | Admitting: Emergency Medicine

## 2015-04-22 DIAGNOSIS — K0889 Other specified disorders of teeth and supporting structures: Secondary | ICD-10-CM | POA: Insufficient documentation

## 2015-04-22 DIAGNOSIS — Z87891 Personal history of nicotine dependence: Secondary | ICD-10-CM | POA: Insufficient documentation

## 2015-04-22 DIAGNOSIS — K029 Dental caries, unspecified: Secondary | ICD-10-CM | POA: Insufficient documentation

## 2015-04-22 MED ORDER — PENICILLIN V POTASSIUM 500 MG PO TABS
500.0000 mg | ORAL_TABLET | Freq: Four times a day (QID) | ORAL | Status: DC
Start: 2015-04-22 — End: 2016-07-06

## 2015-04-22 MED ORDER — IBUPROFEN 800 MG PO TABS: 800.0000 mg | ORAL_TABLET | Freq: Three times a day (TID) | ORAL | Status: DC

## 2015-04-22 MED ORDER — TRAMADOL HCL 50 MG PO TABS: 50.0000 mg | ORAL_TABLET | Freq: Four times a day (QID) | ORAL | Status: DC | PRN

## 2015-04-22 NOTE — Discharge Instructions (Signed)
Dental Pain °Dental pain may be caused by many things, including: °· Tooth decay (cavities or caries). Cavities expose the nerve of your tooth to air and hot or cold temperatures. This can cause pain or discomfort. °· Abscess or infection. A dental abscess is a collection of infected pus from a bacterial infection in the inner part of the tooth (pulp). It usually occurs at the end of the tooth's root. °· Injury. °· An unknown reason (idiopathic). °Your pain may be mild or severe. It may only occur when: °· You are chewing. °· You are exposed to hot or cold temperature. °· You are eating or drinking sugary foods or beverages, such as soda or candy. °Your pain may also be constant. °HOME CARE INSTRUCTIONS °Watch your dental pain for any changes. The following actions may help to lessen any discomfort that you are feeling: °· Take medicines only as directed by your dentist. °· If you were prescribed an antibiotic medicine, finish all of it even if you start to feel better. °· Keep all follow-up visits as directed by your dentist. This is important. °· Do not apply heat to the outside of your face. °· Rinse your mouth or gargle with salt water if directed by your dentist. This helps with pain and swelling. °¨ You can make salt water by adding ¼ tsp of salt to 1 cup of warm water. °· Apply ice to the painful area of your face: °¨ Put ice in a plastic bag. °¨ Place a towel between your skin and the bag. °¨ Leave the ice on for 20 minutes, 2-3 times per day. °· Avoid foods or drinks that cause you pain, such as: °¨ Very hot or very cold foods or drinks. °¨ Sweet or sugary foods or drinks. °SEEK MEDICAL CARE IF: °· Your pain is not controlled with medicines. °· Your symptoms are worse. °· You have new symptoms. °SEEK IMMEDIATE MEDICAL CARE IF: °· You are unable to open your mouth. °· You are having trouble breathing or swallowing. °· You have a fever. °· Your face, neck, or jaw is swollen. °  °This information is not  intended to replace advice given to you by your health care provider. Make sure you discuss any questions you have with your health care provider. °  °Document Released: 01/31/2005 Document Revised: 06/17/2014 Document Reviewed: 01/27/2014 °Elsevier Interactive Patient Education ©2016 Elsevier Inc. ° °Community Resource Guide Dental °The United Way’s “211” is a great source of information about community services available.  Access by dialing 2-1-1 from anywhere in [place], or by website -  www.nc211.org.  ° °Other Local Resources (Updated 02/2015) ° °Dental  Care °  °Services ° °  °Phone Number and Address  °Cost  °[facility] For children 0 - 21 years of age:  °• Cleaning °• Tooth brushing/flossing instruction °• Sealants, fillings, crowns °• Extractions °• Emergency treatment  [phone] °[address] °[place], [place] [address] Charges based on family income.  Medicaid and some insurance plans accepted.   °  °[facility] [place] • Cleaning °• Sealants, fillings, crowns °• Extractions °• Emergency treatment [phone] °[address] °[place], [place] ° Pregnant women 18 years of age or older with a Medicaid card  °[facility] [place] • Cleaning °• Sealants, fillings, crowns °• Extractions °• Emergency treatment [phone] °[address] °[place], [place] Pregnant women 18 years of age or older with a   Medicaid card  °[facility] For children 0 - 21 years of age:  °• Cleaning °• Tooth brushing/flossing instruction °• Sealants, fillings, crowns °• Extractions °• Emergency treatment °Limited orthodontic services for patients with Medicaid [phone] °[address] °[place], [place] [address] Medicaid and [place] Health Choice cover for children up to age 21 and pregnant women.  Parents of children up to age 21 without Medicaid pay a  reduced fee at time of service.  °[facility] [place] For children 0 - 21 years of age:  °• Cleaning °• Tooth brushing/flossing instruction °• Sealants, fillings, crowns °• Extractions °• Emergency treatment °Limited orthodontic services for patients with Medicaid [phone] °[address] °[place], [place].  Medicaid and [place] Health Choice cover for children up to age 21 and pregnant women.  Parents of children up to age 21 without Medicaid pay a reduced fee.  °[facility] • Cleaning °• Sealants, fillings, crowns °• Extractions ° °Hours: Tuesdays and Thursdays, 4:15 - 8 pm [phone] °[address] °[place], [place] [address] Services free of charge to [place] residents ages 18-64 who do not have health insurance, Medicare, Medicaid, or VA benefits and fall within federal poverty guidelines  °[facility] ° ° ° Provides dental care in addition to primary medical care, nutritional counseling, and pharmacy: °• Cleaning °• Sealants, fillings, crowns °• Extractions ° ° ° ° ° ° ° ° ° ° ° ° ° ° ° ° ° [phone] °[facility], [address] °[place], [place] ° °[phone] °[facility], [address] [place], [place] ° °[phone] °[facility] °[place], [place] ° °[phone] °[facility], [address] °[place], [place] ° °[phone] °[facility] °[address] °[place], [place] Accepts Medicaid, Medicare, most insurance.  Also provides services available to all with fees adjusted based on ability to pay.    °[facility] • Cleaning °• Tooth brushing/flossing instruction °• Sealants, fillings, crowns °• Extractions °• Emergency treatment °Hours: Tuesdays, Thursdays, and Fridays from 8 am to 5 pm by appointment only. [phone] °[address] °[place],   [address] [place] residents with Medicaid (depending on eligibility) and children with  Health Choice - call for more information.  °[facility] • Extractions only ° °Hours: 2nd and 4th Thursday of each month from 6:30 am - 9 am.   [phone] ext. [phone] °[address] °[place],  [address] Ages 18 and older only.  Patients are seen on a first come, first served basis.  °[facility] • Cleanings °• Fillings °• Extractions °• Orthodontics °• Endodontics °• Implants/Crowns/Bridges °• Complete and partial dentures [phone] °[place],  Patients must complete an application for services.  There is often a waiting list.   ° °

## 2015-04-22 NOTE — ED Notes (Signed)
Pt reports to the ED for eval of dental pain to the right lower jaw to the posterior molar region where he has a broken tooth. Pt also reports sore throat from post nasal drainage r/t his allergies. Airway intact. Pt A&Ox4, resp e/u, and skin warm and dry.

## 2015-04-22 NOTE — ED Notes (Signed)
Pt ambulates independently and with steady gait at time of discharge. Discharge instructions and follow up information reviewed with patient. No other questions or concerns voiced at this time. RX x 3. 

## 2015-04-22 NOTE — ED Provider Notes (Signed)
CSN: [id number]     Arrival date & time 04/22/15  1302 History  By signing my name below, I, Emmanuella Mensah, attest that this documentation has been prepared under the direction and in the presence of Danelle BerryLeisa Tannie Koskela, PA-C. Electronically Signed: Angelene GiovanniEmmanuella Mensah, ED Scribe. 04/22/2015. 2:31 PM.    Chief Complaint  Patient presents with  . Dental Pain   The history is provided by the patient. No language interpreter was used.   HPI Comments: Carl Davila is a [age] y.o. male who presents to the Emergency Department complaining of gradually worsening right lower dental pain onset one week ago. He reports associated difficulty chewing secondary to pain and right ear pain. He states that he has tried Advil and goody powders with no relief. He states that he will not be able to see his dentist for several weeks. He reports NKDA. No n/v, fever, or chills. No difficulty swallowing, speaking, breathing, opening or closing mouth.   History reviewed. No pertinent past medical history. History reviewed. No pertinent past surgical history. No family history on file. Social History  Substance Use Topics  . Smoking status: Former Games developermoker  . Smokeless tobacco: None  . Alcohol Use: No    Review of Systems  Constitutional: Negative for fever and chills.  HENT: Positive for dental problem and ear pain (rihgt).   Gastrointestinal: Negative for nausea and vomiting.  All other systems reviewed and are negative.     Allergies  Review of patient's allergies indicates no known allergies.  Home Medications   Prior to Admission medications   Medication Sig Start Date End Date Taking? Authorizing Provider  cyclobenzaprine (FLEXERIL) 10 MG tablet Take 1 tablet (10 mg total) by mouth 2 (two) times daily as needed for muscle spasms. 04/15/14   Purvis SheffieldForrest Harrison, MD  diphenhydrAMINE (BENADRYL) 25 MG tablet Take 1 tablet (25 mg total) by mouth every 6 (six) hours as needed for itching (Rash). 04/01/13   Kaitlyn  Szekalski, PA-C  ibuprofen (ADVIL,MOTRIN) 800 MG tablet Take 1 tablet (800 mg total) by mouth 3 (three) times daily. 04/22/15   Danelle BerryLeisa Makaley Storts, PA-C  oxyCODONE-acetaminophen (PERCOCET) 5-325 MG per tablet Take 1-2 tablets by mouth every 6 (six) hours as needed for moderate pain. 04/15/14   Purvis SheffieldForrest Harrison, MD  penicillin v potassium (VEETID) 500 MG tablet Take 1 tablet (500 mg total) by mouth 4 (four) times daily. 04/22/15   Danelle BerryLeisa Brittney Mucha, PA-C  traMADol (ULTRAM) 50 MG tablet Take 1 tablet (50 mg total) by mouth every 6 (six) hours as needed. 04/22/15   Tierre Gerard, PA-C   BP 120/75 mmHg  Pulse 80  Temp(Src) 97.9 F (36.6 C) (Oral)  Resp 14  SpO2 99% Physical Exam  Constitutional: He is oriented to person, place, and time. He appears well-developed and well-nourished. No distress.  HENT:  Head: Normocephalic and atraumatic.  Right Ear: External ear normal.  Left Ear: External ear normal.  Nose: Nose normal.  Mouth/Throat: Uvula is midline, oropharynx is clear and moist and mucous membranes are normal. Mucous membranes are not pale, not dry and not cyanotic. No trismus in the jaw. Abnormal dentition. Dental caries present. No dental abscesses or uvula swelling. No oropharyngeal exudate, posterior oropharyngeal edema, posterior oropharyngeal erythema or tonsillar abscesses.    Multiple missing teeth, overall poor dentition  Eyes: Conjunctivae and EOM are normal. Pupils are equal, round, and reactive to light. Right eye exhibits no discharge. Left eye exhibits no discharge. No scleral icterus.  Neck: Trachea normal, normal  range of motion and full passive range of motion without pain. Neck supple. No JVD present. No spinous process tenderness and no muscular tenderness present. No rigidity. No tracheal deviation, no edema, no erythema and normal range of motion present.  Cardiovascular: Normal rate and regular rhythm.   Pulmonary/Chest: Effort normal and breath sounds normal. No stridor. No respiratory  distress.  Musculoskeletal: Normal range of motion. He exhibits no edema.  Lymphadenopathy:       Head (right side): No submental, no submandibular and no tonsillar adenopathy present.       Head (left side): No submental, no submandibular and no tonsillar adenopathy present.    He has no cervical adenopathy.  Neurological: He is alert and oriented to person, place, and time. He exhibits normal muscle tone. Coordination normal.  Skin: Skin is warm and dry. No rash noted. He is not diaphoretic. No erythema. No pallor.  Psychiatric: He has a normal mood and affect. His behavior is normal. Judgment and thought content normal.  Nursing note and vitals reviewed.   ED Course  Procedures (including critical care time) DIAGNOSTIC STUDIES: Oxygen Saturation is 97% on RA, normal by my interpretation.    COORDINATION OF CARE: 2:26 PM- Pt advised of plan for treatment and pt agrees. Pt will receive Penicillin and pain medication. Pt will follow up with dentist.   MDM   Patient with dentalgia.  No abscess requiring immediate incision and drainage.  Exam not concerning for [name] or pharyngeal abscess.  Will treat with penicillin/pain meds. Pt instructed to follow-up with dentist.  Discussed return precautions. Pt safe for discharge.    Final diagnoses:  Pain, dental   I personally performed the services described in this documentation, which was scribed in my presence. The recorded information has been reviewed and is accurate.     Danelle Berry, PA-C 04/24/15 1814  Alvira Monday, MD 04/24/15 1610

## 2015-05-02 ENCOUNTER — Emergency Department (HOSPITAL_COMMUNITY)
Admission: EM | Admit: 2015-05-02 | Discharge: 2015-05-02 | Disposition: A | Discharge status: Home / Self Care | Payer: Self-pay | Attending: Emergency Medicine | Admitting: Emergency Medicine

## 2015-05-02 ENCOUNTER — Encounter (HOSPITAL_COMMUNITY): Payer: Self-pay

## 2015-05-02 DIAGNOSIS — Z87891 Personal history of nicotine dependence: Secondary | ICD-10-CM | POA: Insufficient documentation

## 2015-05-02 DIAGNOSIS — K0889 Other specified disorders of teeth and supporting structures: Secondary | ICD-10-CM | POA: Insufficient documentation

## 2015-05-02 MED ORDER — IBUPROFEN 600 MG PO TABS: 600.0000 mg | ORAL_TABLET | Freq: Four times a day (QID) | ORAL | Status: DC | PRN

## 2015-05-02 MED ORDER — IBUPROFEN 800 MG PO TABS
800.0000 mg | ORAL_TABLET | Freq: Once | ORAL | Status: AC
  Administered 2015-05-02: 800 mg via ORAL
  Filled 2015-05-02: qty 1

## 2015-05-02 MED ORDER — OXYCODONE-ACETAMINOPHEN 5-325 MG PO TABS
1.0000 | ORAL_TABLET | Freq: Once | ORAL | Status: AC
  Administered 2015-05-02: 1 via ORAL
  Filled 2015-05-02: qty 1

## 2015-05-02 NOTE — ED Notes (Signed)
Went to admin pain medicine per ed triage acute pain protocol, pt asleep. Did not admin

## 2015-05-02 NOTE — ED Notes (Signed)
EDP at bedside  

## 2015-05-02 NOTE — ED Provider Notes (Signed)
CSN: [id number]     Arrival date & time 05/02/15  0207 History   First MD Initiated Contact with Patient 05/02/15 0541     Chief Complaint  Patient presents with  . Dental Pain  . Otalgia     Patient is a 30 y.o. male presenting with tooth pain and ear pain. The history is provided by the patient.  Dental Pain Location:  Lower Quality:  Pulsating Severity:  Moderate Onset quality:  Gradual Duration: several days. Timing:  Constant Progression:  Worsening Chronicity:  Recurrent Relieved by:  Nothing Worsened by:  Jaw movement Associated symptoms: headaches   Associated symptoms: no fever   Associated symptoms comment:  Ear pain  Otalgia Associated symptoms: headaches   Associated symptoms: no fever     History reviewed. No pertinent past medical history. History reviewed. No pertinent past surgical history. No family history on file. Social History  Substance Use Topics  . Smoking status: Former Games developermoker  . Smokeless tobacco: None  . Alcohol Use: No    Review of Systems  Constitutional: Negative for fever.  HENT: Positive for ear pain.   Neurological: Positive for headaches.      Allergies  Review of patient's allergies indicates no known allergies.  Home Medications   Prior to Admission medications   Medication Sig Start Date End Date Taking? Authorizing Provider  Aspirin-Acetaminophen-Caffeine (GOODY HEADACHE PO) Take 1 packet by mouth daily as needed (pain).   Yes Historical Provider, MD  ibuprofen (ADVIL,MOTRIN) 800 MG tablet Take 1 tablet (800 mg total) by mouth 3 (three) times daily. 04/22/15  Yes Danelle BerryLeisa Tapia, PA-C  penicillin v potassium (VEETID) 500 MG tablet Take 1 tablet (500 mg total) by mouth 4 (four) times daily. 04/22/15  Yes Danelle BerryLeisa Tapia, PA-C  traMADol (ULTRAM) 50 MG tablet Take 1 tablet (50 mg total) by mouth every 6 (six) hours as needed. Patient taking differently: Take 50 mg by mouth every 6 (six) hours as needed for moderate pain.  04/22/15  Yes  Leisa Tapia, PA-C   BP 120/84 mmHg  Pulse 88  Temp(Src) 98 F (36.7 C) (Oral)  Resp 18  SpO2 97% Physical Exam CONSTITUTIONAL: Well developed/well nourished HEAD AND FACE: Normocephalic/atraumatic EYES: EOMI ENMT: Mucous membranes moist.  Poor dentition.  No trismus.  No focal abscess noted. Tenderness to left lower molar.  Bilateral TM clear/intact NECK: supple no meningeal signs CV: S1/S2 noted, no murmurs/rubs/gallops noted LUNGS: Lungs are clear to auscultation bilaterally, no apparent distress ABDOMEN: soft, nontender, no rebound or guarding NEURO: Pt is awake/alert, moves all extremitiesx4, no facial droop EXTREMITIES:full ROM SKIN: warm, color normal  ED Course  Procedures  Pt already on tramadol/PCN and has dental f/u arranged Advised to continue home meds  MDM   Final diagnoses:  None    Nursing notes including past medical history and social history reviewed and considered in documentation     Zadie Rhineonald Damione Robideau, MD 05/02/15 [phone]

## 2015-05-02 NOTE — ED Notes (Signed)
Pt comfortable with discharge and follow up instructions. Pt declines wheelchair, escorted to waiting area by this RN. Prescriptions x1. 

## 2015-05-02 NOTE — ED Notes (Signed)
Pt refusing discharge without narcotic pain medication. Requests to speak with EDP. Dr. Bebe ShaggyWickline aware and at bedside.

## 2016-04-22 ENCOUNTER — Encounter (HOSPITAL_COMMUNITY): Payer: Self-pay

## 2016-04-22 ENCOUNTER — Emergency Department (HOSPITAL_COMMUNITY)
Admission: EM | Admit: 2016-04-22 | Discharge: 2016-04-22 | Disposition: A | Discharge status: Home / Self Care | Payer: Self-pay | Attending: Emergency Medicine | Admitting: Emergency Medicine

## 2016-04-22 DIAGNOSIS — F17200 Nicotine dependence, unspecified, uncomplicated: Secondary | ICD-10-CM | POA: Insufficient documentation

## 2016-04-22 DIAGNOSIS — R112 Nausea with vomiting, unspecified: Secondary | ICD-10-CM | POA: Insufficient documentation

## 2016-04-22 DIAGNOSIS — R197 Diarrhea, unspecified: Secondary | ICD-10-CM | POA: Insufficient documentation

## 2016-04-22 DIAGNOSIS — Z7982 Long term (current) use of aspirin: Secondary | ICD-10-CM | POA: Insufficient documentation

## 2016-04-22 LAB — I-STAT CHEM 8, ED
BUN: 15 mg/dL (ref 6–20)
CALCIUM ION: 1.2 mmol/L (ref 1.15–1.40)
CREATININE: 1.1 mg/dL (ref 0.61–1.24)
Chloride: 102 mmol/L (ref 101–111)
GLUCOSE: 100 mg/dL — AB (ref 65–99)
HCT: 41 % (ref 39.0–52.0)
Hemoglobin: 13.9 g/dL (ref 13.0–17.0)
Potassium: 3.8 mmol/L (ref 3.5–5.1)
Sodium: 143 mmol/L (ref 135–145)
TCO2: 30 mmol/L (ref 0–100)

## 2016-04-22 LAB — URINALYSIS, ROUTINE W REFLEX MICROSCOPIC
BILIRUBIN URINE: NEGATIVE
GLUCOSE, UA: NEGATIVE mg/dL
HGB URINE DIPSTICK: NEGATIVE
Ketones, ur: NEGATIVE mg/dL
Leukocytes, UA: NEGATIVE
Nitrite: NEGATIVE
PROTEIN: NEGATIVE mg/dL
Specific Gravity, Urine: 1.017 (ref 1.005–1.030)
pH: 8 (ref 5.0–8.0)

## 2016-04-22 MED ORDER — ONDANSETRON 4 MG PO TBDP
4.0000 mg | ORAL_TABLET | Freq: Once | ORAL | Status: AC
  Administered 2016-04-22: 4 mg via ORAL
  Filled 2016-04-22: qty 1

## 2016-04-22 MED ORDER — ONDANSETRON 4 MG PO TBDP: 4.0000 mg | ORAL_TABLET | Freq: Three times a day (TID) | ORAL | 0 refills | Status: DC | PRN

## 2016-04-22 NOTE — ED Provider Notes (Signed)
[facility]-EMERGENCY DEPT Provider Note   CSN: [id number] Arrival date & time: 04/22/16  1434  By signing my name below, I, Doreatha Martin, attest that this documentation has been prepared under the direction and in the presence of  [facility] M. Damian Leavell, NP. Electronically Signed: Doreatha Martin, ED Scribe. 04/22/16. 4:27 PM.    History   Chief Complaint Chief Complaint  Patient presents with  . Generalized Body Aches    HPI Carl Davila is a 31 y.o. male who presents to the Emergency Department complaining of moderate, generalized body aches that began this morning. Pt reports associated congestion, sore throat, nausea, one episode of vomiting, multiple episodes of watery brown diarrhea, HA, fever. Pt states he took ibuprofen when he woke up with resolution of his fever. Last episode of diarrhea was 2 hours ago. He states his sore throat has currently improved. He reports trying to eat a fast food breakfast sandwich, but it made him feel worse. He is tolerating fluids without difficulty. Pt reports he has worked 70-80 hours a week recently, but has no known sick contacts. He denies frequency, dysuria, ear pain, leg swelling, chills.      The history is provided by the patient. No language interpreter was used.  Influenza  Presenting symptoms: diarrhea, fever (resolved), headache, myalgias (generalized), nausea, sore throat (resolved) and vomiting   Severity:  Moderate Onset quality:  Gradual Duration:  1 day Progression:  Unchanged Chronicity:  New Relieved by:  OTC medications and drinking Worsened by:  Nothing Associated symptoms: chills and nasal congestion   Associated symptoms: no ear pain   Risk factors: no sick contacts     History reviewed. No pertinent past medical history.  There are no active problems to display for this patient.   History reviewed. No pertinent surgical history.     Home Medications    Prior to Admission medications   Medication Sig Start Date End Date  Taking? Authorizing Provider  Aspirin-Acetaminophen-Caffeine (GOODY HEADACHE PO) Take 1 packet by mouth daily as needed (pain).    Historical Provider, MD  ibuprofen (ADVIL,MOTRIN) 600 MG tablet Take 1 tablet (600 mg total) by mouth every 6 (six) hours as needed. 05/02/15   Zadie Rhine, MD  ondansetron (ZOFRAN ODT) 4 MG disintegrating tablet Take 1 tablet (4 mg total) by mouth every 8 (eight) hours as needed for nausea or vomiting. 04/22/16   Hope Orlene Och, NP  penicillin v potassium (VEETID) 500 MG tablet Take 1 tablet (500 mg total) by mouth 4 (four) times daily. 04/22/15   Danelle Berry, PA-C  traMADol (ULTRAM) 50 MG tablet Take 1 tablet (50 mg total) by mouth every 6 (six) hours as needed. Patient taking differently: Take 50 mg by mouth every 6 (six) hours as needed for moderate pain.  04/22/15   Danelle Berry, PA-C    Family History No family history on file.  Social History Social History  Substance Use Topics  . Smoking status: Current Every Day Smoker  . Smokeless tobacco: Never Used  . Alcohol use No     Allergies   Patient has no known allergies.   Review of Systems Review of Systems  Constitutional: Positive for chills and fever (resolved).  HENT: Positive for congestion and sore throat (resolved). Negative for ear pain.   Cardiovascular: Negative for leg swelling.  Gastrointestinal: Positive for diarrhea, nausea and vomiting.  Genitourinary: Negative for dysuria and frequency.  Musculoskeletal: Positive for myalgias (generalized).  Skin: Negative for rash.  Neurological: Positive for  headaches.     Physical Exam Updated Vital Signs BP 118/77 (BP Location: Right Arm)   Pulse 78   Temp 98.5 F (36.9 C) (Oral)   Resp 18   SpO2 100%   Physical Exam  Constitutional: He appears well-developed and well-nourished. No distress.  HENT:  Head: Normocephalic and atraumatic.  Right Ear: Tympanic membrane normal.  Left Ear: Tympanic membrane normal.  Mouth/Throat: Uvula is  midline, oropharynx is clear and moist and mucous membranes are normal. No posterior oropharyngeal edema or posterior oropharyngeal erythema.  Uvula midline, no edema or erythema.   Eyes: EOM are normal. Pupils are equal, round, and reactive to light. Right conjunctiva is injected.  Right sclera injected  Neck: Normal range of motion. Neck supple.  Cardiovascular: Normal rate and regular rhythm.   DP pulses 2+  Pulmonary/Chest: Effort normal. He has no wheezes. He has no rales.  Abdominal: Soft. Bowel sounds are normal. There is no tenderness.  Musculoskeletal: Normal range of motion.  No lower extremity edema  Lymphadenopathy:    He has no cervical adenopathy.  Neurological: He is alert.  Skin: Skin is warm and dry.  Psychiatric: He has a normal mood and affect. His behavior is normal.  Nursing note and vitals reviewed.    ED Treatments / Results   DIAGNOSTIC STUDIES: Oxygen Saturation is 98% on RA, normal by my interpretation.    COORDINATION OF CARE: 4:22 PM Discussed treatment plan with pt at bedside which includes UA, I-stat chem 8, antiemetic, PO challenge and pt agreed to plan.  6:09 PM Pt reevaluated, he reports his symptoms have improved. He is tolerating POs.     Labs (all labs ordered are listed, but only abnormal results are displayed) Labs Reviewed  URINALYSIS, ROUTINE W REFLEX MICROSCOPIC - Abnormal; Notable for the following:       Result Value   APPearance CLOUDY (*)    All other components within normal limits  I-STAT CHEM 8, ED - Abnormal; Notable for the following:    Glucose, Bld 100 (*)    All other components within normal limits    Procedures Procedures (including critical care time)  Medications Ordered in ED Medications  ondansetron (ZOFRAN-ODT) disintegrating tablet 4 mg (4 mg Oral Given 04/22/16 1646)     Initial Impression / Assessment and Plan / ED Course  I have reviewed the triage vital signs and the nursing notes.  Pertinent labs  results that were available during my care of the patient were reviewed by me and considered in my medical decision making (see chart for details).   Carl Davila presents to the ED for evaluation of generalized body aches,and n/v/d, likely viral illness. Patient will be sent home with Zofran. Conservative therapies discussed and recommended. Patient advised to follow up with PCP as needed, or with worsening symptoms. Patient appears stable for discharge at this time. Return precautions discussed and outlined in discharge paperwork. Patient is agreeable to plan.     Final Clinical Impressions(s) / ED Diagnoses   Final diagnoses:  Nausea vomiting and diarrhea    New Prescriptions Discharge Medication List as of 04/22/2016  6:10 PM    START taking these medications   Details  ondansetron (ZOFRAN ODT) 4 MG disintegrating tablet Take 1 tablet (4 mg total) by mouth every 8 (eight) hours as needed for nausea or vomiting., Starting Fri 04/22/2016, Print        I personally performed the services described in this documentation, which was scribed in  my presence. The recorded information has been reviewed and is accurate.    [address] [place], [place] 04/26/16 1610    Gwyneth Sprout, MD 04/26/16 1131

## 2016-04-22 NOTE — ED Triage Notes (Signed)
Pt reports generalized body aches, fatigue, chills, runny nose, and cough; onset last night.

## 2016-04-22 NOTE — Discharge Instructions (Signed)
Rest, drink plenty of fluids to prevent dehydration. Return if symptoms worsen.

## 2016-07-06 ENCOUNTER — Emergency Department (HOSPITAL_COMMUNITY)
Admission: EM | Admit: 2016-07-06 | Discharge: 2016-07-06 | Disposition: A | Discharge status: Home / Self Care | Payer: Self-pay

## 2016-07-06 ENCOUNTER — Emergency Department (HOSPITAL_COMMUNITY): Payer: Self-pay

## 2016-07-06 DIAGNOSIS — M1612 Unilateral primary osteoarthritis, left hip: Secondary | ICD-10-CM | POA: Insufficient documentation

## 2016-07-06 DIAGNOSIS — R52 Pain, unspecified: Secondary | ICD-10-CM

## 2016-07-06 DIAGNOSIS — M1610 Unilateral primary osteoarthritis, unspecified hip: Secondary | ICD-10-CM

## 2016-07-06 DIAGNOSIS — F17200 Nicotine dependence, unspecified, uncomplicated: Secondary | ICD-10-CM | POA: Insufficient documentation

## 2016-07-06 MED ORDER — NAPROXEN 500 MG PO TABS
500.0000 mg | ORAL_TABLET | Freq: Two times a day (BID) | ORAL | 0 refills | Status: DC
Start: 2016-07-06 — End: 2016-09-11

## 2016-07-06 MED ORDER — TRAMADOL HCL 50 MG PO TABS: 50.0000 mg | ORAL_TABLET | Freq: Four times a day (QID) | ORAL | 0 refills | Status: DC | PRN

## 2016-07-06 NOTE — ED Provider Notes (Signed)
[facility]-EMERGENCY DEPT Provider Note   CSN: [id number] Arrival date & time: 07/06/16  0131     History   Chief Complaint Chief Complaint  Patient presents with  . Leg Pain    HPI Carl Davila is a 31 y.o. male.  Patient presents to the emergency department for evaluation of left leg pain. Patient reports that the pain has been ongoing for approximately 1 week. Patient reports pain in the area of the left groin as well as posterior thigh area. This worsens with walking. He denies any direct injury. He does, however, report that he has been working more than usual and has been on his feet a lot. He did have some mild back pain earlier, but this has resolved. No history of chronic back pain. He does not have any numbness, tingling or weakness of lower extremities. No change in sensation.      No past medical history on file.  There are no active problems to display for this patient.   No past surgical history on file.     Home Medications    Prior to Admission medications   Medication Sig Start Date End Date Taking? Authorizing Provider  naproxen (NAPROSYN) 500 MG tablet Take 1 tablet (500 mg total) by mouth 2 (two) times daily. 07/06/16   Gilda Crease, MD  traMADol (ULTRAM) 50 MG tablet Take 1 tablet (50 mg total) by mouth every 6 (six) hours as needed. 07/06/16   Gilda Crease, MD    Family History No family history on file.  Social History Social History  Substance Use Topics  . Smoking status: Current Every Day Smoker  . Smokeless tobacco: Never Used  . Alcohol use No     Allergies   Patient has no known allergies.   Review of Systems Review of Systems  Respiratory: Negative for shortness of breath.   Cardiovascular: Negative for chest pain.  Musculoskeletal: Positive for arthralgias, back pain and myalgias.     Physical Exam Updated Vital Signs BP 109/76 (BP Location: Right Arm)   Pulse 66   Temp 97.5 F (36.4 C) (Oral)    Resp 17   SpO2 98%   Physical Exam  Constitutional: He is oriented to person, place, and time. He appears well-developed and well-nourished. No distress.  HENT:  Head: Normocephalic and atraumatic.  Right Ear: Hearing normal.  Left Ear: Hearing normal.  Nose: Nose normal.  Mouth/Throat: Oropharynx is clear and moist and mucous membranes are normal.  Eyes: Conjunctivae and EOM are normal. Pupils are equal, round, and reactive to light.  Neck: Normal range of motion. Neck supple.  Cardiovascular: Regular rhythm, S1 normal and S2 normal.  Exam reveals no gallop and no friction rub.   No murmur heard. Pulmonary/Chest: Effort normal and breath sounds normal. No respiratory distress. He exhibits no tenderness.  Abdominal: Soft. Normal appearance and bowel sounds are normal. There is no hepatosplenomegaly. There is no tenderness. There is no rebound, no guarding, no tenderness at McBurney's point and negative Murphy's sign. No hernia.  Musculoskeletal: Normal range of motion.       Left hip: He exhibits tenderness (hip flexor). He exhibits normal range of motion and normal strength.       Left upper leg: He exhibits tenderness (posterior soft tissue). He exhibits no swelling.  Neurological: He is alert and oriented to person, place, and time. He has normal strength. No cranial nerve deficit or sensory deficit. Coordination normal. GCS eye subscore is 4. GCS  verbal subscore is 5. GCS motor subscore is 6.  Skin: Skin is warm, dry and intact. No rash noted. No cyanosis.  Psychiatric: He has a normal mood and affect. His speech is normal and behavior is normal. Thought content normal.  Nursing note and vitals reviewed.    ED Treatments / Results  Labs (all labs ordered are listed, but only abnormal results are displayed) Labs Reviewed - No data to display  EKG  EKG Interpretation None       Radiology Dg Hip Unilat With Pelvis 2-3 Views Left  Result Date: 07/06/2016 CLINICAL DATA:  Leg  pain.  Weakness. EXAM: DG HIP (WITH OR WITHOUT PELVIS) 2-3V LEFT COMPARISON:  No prior. FINDINGS: Dysplastic and degenerative changes noted about both hips. Small corticated bony density noted adjacent to the left hip most likely secondary to degenerative change. This may represent a loose body. No evidence of acute fracture. No evidence of dislocation. IMPRESSION: Degenerative and dysplastic changes both hips. Tiny corticated bony density noted adjacent to the left hip, possibly a loose body. No acute abnormality. No evidence of fracture or dislocation. Electronically Signed   By: Maisie Fushomas  Register   On: 07/06/2016 06:27    Procedures Procedures (including critical care time)  Medications Ordered in ED Medications - No data to display   Initial Impression / Assessment and Plan / ED Course  I have reviewed the triage vital signs and the nursing notes.  Pertinent labs & imaging results that were available during my care of the patient were reviewed by me and considered in my medical decision making (see chart for details).    Patient presents with complaints of pain in the left groin and upper thigh area. He does report increased activity recently, noted trauma. There is no swelling to suggest trauma or compartment syndrome. He has distal pulses intact. No discoloration or signs of infection. He has normal range of motion. No back pain currently. This does not appear to be radicular in nature. Patient does not have saddle anesthesia or foot drop. X-ray shows significant degenerative changes for his age. Patient was treated with analgesia and rest.   Final Clinical Impressions(s) / ED Diagnoses   Final diagnoses:  Arthritis of hip    New Prescriptions Discharge Medication List as of 07/06/2016  7:15 AM    START taking these medications   Details  naproxen (NAPROSYN) 500 MG tablet Take 1 tablet (500 mg total) by mouth 2 (two) times daily., Starting Wed 07/06/2016, Print         Pollina,  Canary Brimhristopher J, MD 07/07/16 [phone]

## 2016-07-06 NOTE — ED Triage Notes (Signed)
Pt reports he has had increasing left leg pain for the past week.  He states that he has had "surgeries" on his legs in the past.  He reports that his legs are weak and feel like they are "tightening up."  Pt is not aware of any swelling, temperature change or redness to the legs at this time.  Pt walked into the ED.

## 2016-07-06 NOTE — ED Notes (Signed)
Pt returned to the room.

## 2016-09-11 ENCOUNTER — Encounter (HOSPITAL_COMMUNITY): Payer: Self-pay | Admitting: Emergency Medicine

## 2016-09-11 ENCOUNTER — Emergency Department (HOSPITAL_COMMUNITY)
Admission: EM | Admit: 2016-09-11 | Discharge: 2016-09-11 | Disposition: A | Discharge status: Home / Self Care | Payer: Self-pay | Attending: Emergency Medicine | Admitting: Emergency Medicine

## 2016-09-11 DIAGNOSIS — M25562 Pain in left knee: Secondary | ICD-10-CM | POA: Insufficient documentation

## 2016-09-11 DIAGNOSIS — M545 Low back pain: Secondary | ICD-10-CM | POA: Insufficient documentation

## 2016-09-11 DIAGNOSIS — F17200 Nicotine dependence, unspecified, uncomplicated: Secondary | ICD-10-CM | POA: Insufficient documentation

## 2016-09-11 DIAGNOSIS — M25552 Pain in left hip: Secondary | ICD-10-CM | POA: Insufficient documentation

## 2016-09-11 MED ORDER — NAPROXEN 250 MG PO TABS
500.0000 mg | ORAL_TABLET | Freq: Once | ORAL | Status: AC
Start: 2016-09-11 — End: 2016-09-11
  Administered 2016-09-11: 500 mg via ORAL
  Filled 2016-09-11: qty 2

## 2016-09-11 MED ORDER — TRAMADOL HCL 50 MG PO TABS: 50.0000 mg | ORAL_TABLET | Freq: Four times a day (QID) | ORAL | 0 refills | Status: DC | PRN

## 2016-09-11 MED ORDER — NAPROXEN 500 MG PO TABS: 500.0000 mg | ORAL_TABLET | Freq: Two times a day (BID) | ORAL | 0 refills | Status: DC

## 2016-09-11 NOTE — ED Provider Notes (Signed)
[facility] Provider Note   CSN: [id number] Arrival date & time: 09/11/16  1412   By signing my name below, I, Clarisse GougeXavier Herndon, attest that this documentation has been prepared under the direction and in the presence of Rolan BuccoBelfi, Keylie Beavers, MD. Electronically signed, Clarisse GougeXavier Herndon, ED Scribe. 09/11/16. 3:22 PM.   History   Chief Complaint Chief Complaint  Patient presents with  . Hip Pain    left  . Knee Pain   The history is provided by the patient and medical records. No language interpreter was used.    Carl Davila is a 31 y.o. male presenting to the Emergency Department concerning significantly worsened L hip pain since yesterday. Associated L knee and L low back pain. Pt states the hip pain has been present x 6 months, and the knee has been present x "years". He describes 10/10, constant aches near the hip that are worse with ambulation and prolonged standing. He reports occasional knee swelling, though none recently. Pt expresses concern for FMHx of hip and knee problems. No recent activity change. He states he recently ran out of medications prescribed to control his pain. No recent trauma/ injury. No leg swelling, joint swelling or any other complaints noted at this time.   History reviewed. No pertinent past medical history. There are no active problems to display for this patient. History reviewed. No pertinent surgical history.  Home Medications    Prior to Admission medications   Medication Sig Start Date End Date Taking? Authorizing Provider  naproxen (NAPROSYN) 500 MG tablet Take 1 tablet (500 mg total) by mouth 2 (two) times daily. 09/11/16   Rolan BuccoBelfi, Cadarius Nevares, MD  traMADol (ULTRAM) 50 MG tablet Take 1 tablet (50 mg total) by mouth every 6 (six) hours as needed. 09/11/16   Rolan BuccoBelfi, Mikaylee Arseneau, MD    Family History No family history on file.  Social History Social History  Substance Use Topics  . Smoking status: Current Every Day Smoker  . Smokeless tobacco: Never  Used  . Alcohol use No     Allergies   Patient has no known allergies.   Review of Systems Review of Systems  Constitutional: Negative for fever.  Respiratory: Negative for shortness of breath.   Cardiovascular: Negative for leg swelling.  Gastrointestinal: Negative for nausea and vomiting.  Musculoskeletal: Positive for arthralgias and gait problem. Negative for joint swelling.  Skin: Negative for color change and wound.  Allergic/Immunologic: Negative for immunocompromised state.  Neurological: Negative for weakness and headaches.  Hematological: Does not bruise/bleed easily.     Physical Exam Updated Vital Signs BP 116/73 (BP Location: Left Arm)   Pulse 82   Temp 98.3 F (36.8 C) (Oral)   Resp 16   Ht 5\' 8"  (1.727 m)   Wt 130 lb (59 kg)   SpO2 97%   BMI 19.77 kg/m   Physical Exam  Constitutional: He is oriented to person, place, and time. He appears well-developed and well-nourished.  HENT:  Head: Normocephalic and atraumatic.  Neck: Normal range of motion. Neck supple.  Cardiovascular: Normal rate.   Pulmonary/Chest: Effort normal.  Musculoskeletal: He exhibits tenderness.  Pain on ROM of the L hip and pain in posterior aspect of the hip. No pain along the spine. No specific tenderness to the L knee. No swelling of the leg. NL motor function and sensation of the leg. Pedal pulse is intact.  Neurological: He is alert and oriented to person, place, and time.  Skin: Skin is warm and dry.  Psychiatric: He has a normal mood and affect.     ED Treatments / Results  DIAGNOSTIC STUDIES: Oxygen Saturation is 97% on RA, NL by my interpretation.    COORDINATION OF CARE: 3:17 PM-Discussed next steps with pt. Pt verbalized understanding and is agreeable with the plan. Order medication, Rx naproxen and tramadol and refer to orthopedist. Pt prepared for d/c, advised of symptomatic care at home, F/U instructions and return precautions.    Labs (all labs ordered are  listed, but only abnormal results are displayed) Labs Reviewed - No data to display  EKG  EKG Interpretation None       Radiology No results found.  Procedures Procedures (including critical care time)  Medications Ordered in ED Medications  naproxen (NAPROSYN) tablet 500 mg (not administered)     Initial Impression / Assessment and Plan / ED Course  I have reviewed the triage vital signs and the nursing notes.  Pertinent labs & imaging results that were available during my care of the patient were reviewed by me and considered in my medical decision making (see chart for details).     Patient presents with pain in his left hip. It radiates down his leg seems to be originating from the hip. There is no swelling or suggestions of a DVT. He's neurologically intact. There is no palpable spinal tenderness. He had recent x-rays in May which I reviewed. He does have significant degenerative changes. He was started back on Naprosyn and tramadol. He was referred to follow-up with orthopedics.  Final Clinical Impressions(s) / ED Diagnoses   Final diagnoses:  Left hip pain    New Prescriptions Current Discharge Medication List    I personally performed the services described in this documentation, which was scribed in my presence.  The recorded information has been reviewed and considered.    Rolan BuccoBelfi, Dametria Tuzzolino, MD 09/11/16 [phone]

## 2016-09-11 NOTE — ED Triage Notes (Signed)
Pt.stated, I have left hip and left knee pain that started yesterday . Its so tender I can't walk on it.  This runs in my family

## 2016-09-11 NOTE — ED Notes (Signed)
Patient Alert and oriented X4. Stable and ambulatory. Patient verbalized understanding of the discharge instructions.  Patient belongings were taken by the patient.  

## 2016-12-25 ENCOUNTER — Emergency Department (HOSPITAL_COMMUNITY)
Admission: EM | Admit: 2016-12-25 | Discharge: 2016-12-25 | Disposition: A | Discharge status: Home / Self Care | Payer: Self-pay | Attending: Emergency Medicine | Admitting: Emergency Medicine

## 2016-12-25 ENCOUNTER — Encounter (HOSPITAL_COMMUNITY): Payer: Self-pay

## 2016-12-25 DIAGNOSIS — A084 Viral intestinal infection, unspecified: Secondary | ICD-10-CM

## 2016-12-25 DIAGNOSIS — F17200 Nicotine dependence, unspecified, uncomplicated: Secondary | ICD-10-CM | POA: Insufficient documentation

## 2016-12-25 LAB — COMPREHENSIVE METABOLIC PANEL
ALBUMIN: 3.9 g/dL (ref 3.5–5.0)
ALK PHOS: 52 U/L (ref 38–126)
ALT: 18 U/L (ref 17–63)
ANION GAP: 6 (ref 5–15)
AST: 19 U/L (ref 15–41)
BUN: 14 mg/dL (ref 6–20)
CALCIUM: 9.2 mg/dL (ref 8.9–10.3)
CHLORIDE: 105 mmol/L (ref 101–111)
CO2: 28 mmol/L (ref 22–32)
Creatinine, Ser: 0.81 mg/dL (ref 0.61–1.24)
GFR calc non Af Amer: >60 mL/min (ref >60)
GLUCOSE: 86 mg/dL (ref 65–99)
POTASSIUM: 3.5 mmol/L (ref 3.5–5.1)
Sodium: 139 mmol/L (ref 135–145)
Total Bilirubin: 0.7 mg/dL (ref 0.3–1.2)
Total Protein: 6.7 g/dL (ref 6.5–8.1)

## 2016-12-25 LAB — LIPASE, BLOOD: Lipase: 29 U/L (ref 11–51)

## 2016-12-25 LAB — URINALYSIS, ROUTINE W REFLEX MICROSCOPIC
BACTERIA UA: NONE SEEN
Bilirubin Urine: NEGATIVE
GLUCOSE, UA: NEGATIVE mg/dL
HGB URINE DIPSTICK: NEGATIVE
KETONES UR: NEGATIVE mg/dL
Leukocytes, UA: NEGATIVE
NITRITE: NEGATIVE
PROTEIN: 30 mg/dL — AB
Specific Gravity, Urine: 1.027 (ref 1.005–1.030)
pH: 6 (ref 5.0–8.0)

## 2016-12-25 LAB — CBC
HEMATOCRIT: 43.3 % (ref 39.0–52.0)
HEMOGLOBIN: 14.7 g/dL (ref 13.0–17.0)
MCH: 29.5 pg (ref 26.0–34.0)
MCHC: 33.9 g/dL (ref 30.0–36.0)
MCV: 86.8 fL (ref 78.0–100.0)
Platelets: 302 10*3/uL (ref 150–400)
RBC: 4.99 MIL/uL (ref 4.22–5.81)
RDW: 13 % (ref 11.5–15.5)
WBC: 9.3 10*3/uL (ref 4.0–10.5)

## 2016-12-25 MED ORDER — PROMETHAZINE HCL 25 MG PO TABS: 25.0000 mg | ORAL_TABLET | Freq: Four times a day (QID) | ORAL | 0 refills | Status: DC | PRN

## 2016-12-25 MED ORDER — IBUPROFEN 600 MG PO TABS: 600.0000 mg | ORAL_TABLET | Freq: Four times a day (QID) | ORAL | 0 refills | Status: DC | PRN

## 2016-12-25 MED ORDER — DICYCLOMINE HCL 10 MG/ML IM SOLN
20.0000 mg | Freq: Once | INTRAMUSCULAR | Status: AC
  Administered 2016-12-25: 20 mg via INTRAMUSCULAR
  Filled 2016-12-25: qty 2

## 2016-12-25 MED ORDER — ONDANSETRON 4 MG PO TBDP
8.0000 mg | ORAL_TABLET | Freq: Once | ORAL | Status: AC
  Administered 2016-12-25: 8 mg via ORAL
  Filled 2016-12-25: qty 2

## 2016-12-25 NOTE — ED Triage Notes (Signed)
Pt states that he began to have abd pain today with n/v/d, denies fevers. Pt also c/o of headache

## 2016-12-25 NOTE — Discharge Instructions (Signed)
We recommend that you drink plenty of clear liquids to prevent dehydration.  Avoid fatty foods, greasy foods, fried foods, and milk products until symptoms resolve.  You may take Phenergan as needed for persistent nausea and to prevent vomiting.  Take ibuprofen as needed for abdominal pain or cramping.  You may return to the emergency department, as needed, for new or concerning symptoms.

## 2016-12-25 NOTE — ED Provider Notes (Signed)
[facility] EMERGENCY [facility]PARTMENT Provider Note   CSN: [id number] Arrival date & time: 12/25/16  1949     History   Chief Complaint Chief Complaint  Patient presents with  . Abdominal Pain    HPI Carl Davila is a 31 y.o. male.  31 year old male presents to the emergency department for evaluation of nausea, vomiting, and diarrhea.  Symptoms began this morning and have been intermittent.  He reports that he last vomited at 1700.  He has also had frequent, watery diarrhea which is aggravated with oral intake.  He has been able to drink water since his last episode of vomiting.  Patient further reports generalized abdominal discomfort; nonradiating.  He notes being around individuals at work who were sick with "a stomach virus".  He denies any fever, hematemesis, melena, hematochezia, urinary symptoms.  No history of abdominal surgeries.   The history is provided by the patient. No language interpreter was used.  Abdominal Pain      History reviewed. No pertinent past medical history.  There are no active problems to display for this patient.   History reviewed. No pertinent surgical history.     Home Medications    Prior to Admission medications   Medication Sig Start Date End Date Taking? Authorizing Provider  ibuprofen (ADVIL,MOTRIN) 600 MG tablet Take 1 tablet (600 mg total) every 6 (six) hours as needed by mouth for headache, mild pain, moderate pain or cramping. 12/25/16   Antony MaduraHumes, Kilian Schwartz, PA-C  promethazine (PHENERGAN) 25 MG tablet Take 1 tablet (25 mg total) every 6 (six) hours as needed by mouth for nausea or vomiting. 12/25/16   Antony MaduraHumes, Jalan Fariss, PA-C    Family History No family history on file.  Social History Social History   Tobacco Use  . Smoking status: Current Every Day Smoker  . Smokeless tobacco: Never Used  Substance Use Topics  . Alcohol use: No  . Drug use: No     Allergies   Patient has no known allergies.   Review of  Systems Review of Systems  Gastrointestinal: Positive for abdominal pain.  Ten systems reviewed and are negative for acute change, except as noted in the HPI.    Physical Exam Updated Vital Signs BP 114/88   Pulse 72   Temp 97.9 F (36.6 C) (Oral)   Resp 12   Ht 5\' 8"  (1.727 m)   Wt 61.2 kg (135 lb)   SpO2 99%   BMI 20.53 kg/m   Physical Exam  Constitutional: He is oriented to person, place, and time. He appears well-developed and well-nourished. No distress.  Nontoxic appearing and in no acute distress  HENT:  Head: Normocephalic and atraumatic.  Eyes: Conjunctivae and EOM are normal. No scleral icterus.  Neck: Normal range of motion.  Cardiovascular: Normal rate, regular rhythm and intact distal pulses.  Pulmonary/Chest: Effort normal. No stridor. No respiratory distress. He has no wheezes.  Respirations even and unlabored.  Abdominal: Normal appearance.  Soft, nondistended abdomen with normoactive bowel sounds.  No focal tenderness appreciated.  No guarding or peritoneal signs.  Musculoskeletal: Normal range of motion.  Neurological: He is alert and oriented to person, place, and time. He exhibits normal muscle tone. Coordination normal.  Skin: Skin is warm and dry. No rash noted. He is not diaphoretic. No erythema. No pallor.  Psychiatric: He has a normal mood and affect. His behavior is normal.  Nursing note and vitals reviewed.    ED Treatments / Results  Labs (all  labs ordered are listed, but only abnormal results are displayed) Labs Reviewed  URINALYSIS, ROUTINE W REFLEX MICROSCOPIC - Abnormal; Notable for the following components:      Result Value   Protein, ur 30 (*)    Squamous Epithelial / LPF 0-5 (*)    All other components within normal limits  LIPASE, BLOOD  COMPREHENSIVE METABOLIC PANEL  CBC    EKG  EKG Interpretation None       Radiology No results found.  Procedures Procedures (including critical care time)  Medications Ordered in  ED Medications  ondansetron (ZOFRAN-ODT) disintegrating tablet 8 mg (8 mg Oral Given 12/25/16 2159)  dicyclomine (BENTYL) injection 20 mg (20 mg Intramuscular Given 12/25/16 2225)     Initial Impression / Assessment and Plan / ED Course  I have reviewed the triage vital signs and the nursing notes.  Pertinent labs & imaging results that were available during my care of the patient were reviewed by me and considered in my medical decision making (see chart for details).     Patient with symptoms consistent with viral gastroenteritis.  Hx of sick contacts with similar symptoms.  Vitals are stable, no fever.  Lungs are clear.  No focal abdominal pain or peritoneal signs on exam.  Labs reassuring and without leukocytosis or electrolyte derangements.  Liver and kidney function preserved.    Doubt appendicitis, cholecystitis, pancreatitis, ruptured viscus, UTI, kidney stone, or other emergent abdominal etiology.  Nausea controlled in ED with Zofran.  Bentyl given for abdominal cramping.  Supportive therapy indicated with return if symptoms worsen.  Return precautions discussed and provided. Patient discharged in stable condition with no unaddressed concerns.   Final Clinical Impressions(s) / ED Diagnoses   Final diagnoses:  Viral gastroenteritis    ED Discharge Orders        Ordered    promethazine (PHENERGAN) 25 MG tablet  Every 6 hours PRN     12/25/16 2225    ibuprofen (ADVIL,MOTRIN) 600 MG tablet  Every 6 hours PRN     12/25/16 2225       Antony MaduraHumes, Iza Preston, PA-C 12/25/16 2230    Wynetta FinesMessick, Peter C, MD 12/26/16 [phone]

## 2016-12-25 NOTE — ED Notes (Signed)
Pt called for triage x 4 without answer

## 2017-05-31 ENCOUNTER — Emergency Department (HOSPITAL_COMMUNITY)
Admission: EM | Admit: 2017-05-31 | Discharge: 2017-05-31 | Disposition: A | Discharge status: Home / Self Care | Payer: Self-pay | Attending: Emergency Medicine | Admitting: Emergency Medicine

## 2017-05-31 ENCOUNTER — Encounter (HOSPITAL_COMMUNITY): Payer: Self-pay | Admitting: Emergency Medicine

## 2017-05-31 DIAGNOSIS — R0981 Nasal congestion: Secondary | ICD-10-CM | POA: Insufficient documentation

## 2017-05-31 DIAGNOSIS — Z79899 Other long term (current) drug therapy: Secondary | ICD-10-CM | POA: Insufficient documentation

## 2017-05-31 DIAGNOSIS — Z87891 Personal history of nicotine dependence: Secondary | ICD-10-CM | POA: Insufficient documentation

## 2017-05-31 DIAGNOSIS — J0141 Acute recurrent pansinusitis: Secondary | ICD-10-CM | POA: Insufficient documentation

## 2017-05-31 MED ORDER — AZITHROMYCIN 250 MG PO TABS: ORAL_TABLET | ORAL | 0 refills | Status: DC

## 2017-05-31 MED ORDER — DEXAMETHASONE SODIUM PHOSPHATE 10 MG/ML IJ SOLN
10.0000 mg | Freq: Once | INTRAMUSCULAR | Status: AC
Start: 2017-05-31 — End: 2017-05-31
  Administered 2017-05-31: 10 mg via INTRAMUSCULAR
  Filled 2017-05-31: qty 1

## 2017-05-31 NOTE — ED Triage Notes (Signed)
Pt states he has had 1-2 months of allergies/sinus infection. States his eyes have had increased swelling and he has been taking Zyrtec and other OTC meds with no relief. No swelling noted to eyes at present. Pt states his nasal drainage is yellow and sometimes red.

## 2017-05-31 NOTE — Discharge Instructions (Addendum)
Contact a health care provider if: °You have a fever. °Your symptoms get worse. °Your symptoms do not improve within 10 days. °Get help right away if: °You have a severe headache. °You have persistent vomiting. °You have pain or swelling around your face or eyes. °You have vision problems. °You develop confusion. °Your neck is stiff. °You have trouble breathing. °

## 2017-05-31 NOTE — ED Provider Notes (Signed)
[facility] EMERGENCY DEPARTMENT Provider Note   CSN: [id number] Arrival date & time: 05/31/17  0700     History   Chief Complaint Chief Complaint  Patient presents with  . Facial Pain    HPI Carl Davila is a 32 y.o. male with a past medical history of seasonal allergies who presents the emergency department with chief complaint of sinusitis.  Patient has had persistent pansinusitis symptoms for the past 2 months.  He complains of constant facial pressure, sinus headaches, nasal congestion.  He has been using over-the-counter oral antihistamines such as Claritin-D and Zyrtec-D without major relief in his symptoms.  He states that last night he woke with a throbbing sinus headache and facial swelling.  He states that he has noticed some streaky blood.  He denies fever or chills.  He has a dry cough associated with sinus congestion.  HPI  History reviewed. No pertinent past medical history.  There are no active problems to display for this patient.   History reviewed. No pertinent surgical history.      Home Medications    Prior to Admission medications   Medication Sig Start Date End Date Taking? Authorizing Provider  ibuprofen (ADVIL,MOTRIN) 600 MG tablet Take 1 tablet (600 mg total) every 6 (six) hours as needed by mouth for headache, mild pain, moderate pain or cramping. 12/25/16   Antony Madura, PA-C  promethazine (PHENERGAN) 25 MG tablet Take 1 tablet (25 mg total) every 6 (six) hours as needed by mouth for nausea or vomiting. 12/25/16   Antony Madura, PA-C    Family History History reviewed. No pertinent family history.  Social History Social History   Tobacco Use  . Smoking status: Former Games developer  . Smokeless tobacco: Never Used  Substance Use Topics  . Alcohol use: No  . Drug use: No     Allergies   Patient has no known allergies.   Review of Systems Review of Systems Ten systems reviewed and are negative for acute change, except  as noted in the HPI.    Physical Exam Updated Vital Signs BP 121/84 (BP Location: Right Arm)   Pulse 89   Temp 97.6 F (36.4 C)   Resp 18   Ht 5\' 7"  (1.702 m)   Wt 59 kg (130 lb)   SpO2 100%   BMI 20.36 kg/m   Physical Exam  Constitutional: He appears well-developed and well-nourished. No distress.  HENT:  Head: Normocephalic and atraumatic.    Eyes: Pupils are equal, round, and reactive to light. Conjunctivae and EOM are normal. No scleral icterus.  Neck: Normal range of motion. Neck supple.  Cardiovascular: Normal rate, regular rhythm and normal heart sounds.  Pulmonary/Chest: Effort normal and breath sounds normal. No respiratory distress.  Abdominal: Soft. There is no tenderness.  Musculoskeletal: He exhibits no edema.  Neurological: He is alert.  Skin: Skin is warm and dry. He is not diaphoretic.  Psychiatric: His behavior is normal.  Nursing note and vitals reviewed.    ED Treatments / Results  Labs (all labs ordered are listed, but only abnormal results are displayed) Labs Reviewed - No data to display  EKG None  Radiology No results found.  Procedures Procedures (including critical care time)  Medications Ordered in ED Medications - No data to display   Initial Impression / Assessment and Plan / ED Course  I have reviewed the triage vital signs and the nursing notes.  Pertinent labs & imaging results that were available during  my care of the patient were reviewed by me and considered in my medical decision making (see chart for details).     Patient with 2 months of sinus pressure suddenly worsening with streaky blood.  Patient will be treated for bacterial sinusitis with azithromycin, given Decadron IM here for symptom relief patient appears appropriate for discharge at this time.  The patient does not have any signs of bacterial meningitis or viral meningitis.  He has normal head movement, afebrile and hemodynamically stable.  No pain with eye  movement suggestive of orbital cellulitis. I discussed return precautions  Final Clinical Impressions(s) / ED Diagnoses   Final diagnoses:  None    ED Discharge Orders    None       Arthor CaptainHarris, Liliya Fullenwider, PA-C 05/31/17 [id number]    Loren RacerYelverton, David, MD 06/01/17 1544

## 2017-12-04 ENCOUNTER — Emergency Department (HOSPITAL_COMMUNITY)
Admission: EM | Admit: 2017-12-04 | Discharge: 2017-12-04 | Disposition: A | Discharge status: Home / Self Care | Payer: Self-pay | Attending: Emergency Medicine | Admitting: Emergency Medicine

## 2017-12-04 ENCOUNTER — Encounter (HOSPITAL_COMMUNITY): Payer: Self-pay | Admitting: Emergency Medicine

## 2017-12-04 ENCOUNTER — Other Ambulatory Visit: Payer: Self-pay

## 2017-12-04 DIAGNOSIS — J069 Acute upper respiratory infection, unspecified: Secondary | ICD-10-CM | POA: Insufficient documentation

## 2017-12-04 DIAGNOSIS — H9201 Otalgia, right ear: Secondary | ICD-10-CM | POA: Insufficient documentation

## 2017-12-04 LAB — GROUP A STREP BY PCR: GROUP A STREP BY PCR: NOT DETECTED

## 2017-12-04 NOTE — ED Triage Notes (Signed)
Pt to ER for evaluation of sore throat and right ear pain x1 day.

## 2017-12-04 NOTE — ED Provider Notes (Signed)
[facility] EMERGENCY DEPARTMENT Provider Note   CSN: [id number] Arrival date & time: 12/04/17  1259     History   Chief Complaint Chief Complaint  Patient presents with  . Sore Throat  . Otalgia    HPI Carl Davila is a 32 y.o. male with no significant past medical history who presents today for evaluation of sore throat and right ear pain.  He reports that this started this morning when he was at work.  He denies any specific injury.  He has children sick at home with similar symptoms.   He denies any fevers at home, no abdominal pain.    HPI  History reviewed. No pertinent past medical history.  There are no active problems to display for this patient.   History reviewed. No pertinent surgical history.      Home Medications    Prior to Admission medications   Medication Sig Start Date End Date Taking? Authorizing Provider  azithromycin (ZITHROMAX Z-PAK) 250 MG tablet 2 po day one, then 1 daily x 4 days 05/31/17   Arthor Captain, PA-C  ibuprofen (ADVIL,MOTRIN) 600 MG tablet Take 1 tablet (600 mg total) every 6 (six) hours as needed by mouth for headache, mild pain, moderate pain or cramping. 12/25/16   Antony Madura, PA-C  promethazine (PHENERGAN) 25 MG tablet Take 1 tablet (25 mg total) every 6 (six) hours as needed by mouth for nausea or vomiting. 12/25/16   Antony Madura, PA-C    Family History History reviewed. No pertinent family history.  Social History Social History   Tobacco Use  . Smoking status: Former Games developer  . Smokeless tobacco: Never Used  Substance Use Topics  . Alcohol use: No  . Drug use: No     Allergies   Patient has no known allergies.   Review of Systems Review of Systems  Constitutional: Negative for chills and fever.  HENT: Positive for ear pain and sore throat. Negative for congestion, ear discharge, postnasal drip, rhinorrhea, trouble swallowing and voice change.   Respiratory: Negative for chest tightness  and shortness of breath.   Cardiovascular: Negative for chest pain.  Gastrointestinal: Negative for abdominal pain.  All other systems reviewed and are negative.    Physical Exam Updated Vital Signs BP 123/79 (BP Location: Right Arm)   Pulse 91   Temp 98.1 F (36.7 C) (Oral)   Resp 14   SpO2 100%   Physical Exam  Constitutional: He appears well-developed and well-nourished. No distress.  HENT:  Head: Normocephalic and atraumatic.  Right Ear: Tympanic membrane, external ear and ear canal normal.  Left Ear: Tympanic membrane, external ear and ear canal normal.  Nose: Mucosal edema and rhinorrhea present.  Mouth/Throat: Uvula is midline, oropharynx is clear and moist and mucous membranes are normal. No oropharyngeal exudate. No tonsillar exudate.  Serous fluid behind bilateral TMs without evidence of infection.  Eyes: Conjunctivae are normal. No scleral icterus.  Neck: Normal range of motion. Neck supple.  Cardiovascular: Normal rate and regular rhythm.  Pulmonary/Chest: Effort normal and breath sounds normal. No respiratory distress. He has no wheezes.  Abdominal: Soft. There is no tenderness.  Lymphadenopathy:    He has no cervical adenopathy.  Neurological: He is alert.  Skin: Skin is warm and dry. He is not diaphoretic.  Psychiatric: He has a normal mood and affect. His behavior is normal.  Nursing note and vitals reviewed.    ED Treatments / Results  Labs (all labs ordered are listed,  but only abnormal results are displayed) Labs Reviewed  GROUP A STREP BY PCR    EKG None  Radiology No results found.  Procedures Procedures (including critical care time)  Medications Ordered in ED Medications - No data to display   Initial Impression / Assessment and Plan / ED Course  I have reviewed the triage vital signs and the nursing notes.  Pertinent labs & imaging results that were available during my care of the patient were reviewed by me and considered in my  medical decision making (see chart for details).      Patients symptoms are consistent with URI, likely viral etiology.  Given that he has had only a few hour of symptoms, chest x-ray not ordered.  Discussed that antibiotics are not indicated for viral infections. Pt will be discharged with symptomatic treatment.  Verbalizes understanding and is agreeable with plan. Pt is hemodynamically stable & in NAD prior to dc.   Final Clinical Impressions(s) / ED Diagnoses   Final diagnoses:  Right ear pain  Upper respiratory tract infection, unspecified type    ED Discharge Orders    None       Cristina Gong, Cordelia Poche 12/04/17 1455    Pricilla Loveless, MD 12/04/17 1542

## 2017-12-04 NOTE — ED Provider Notes (Signed)
Patient placed in Quick Look pathway, seen and evaluated   Chief Complaint: sore throat and ear ache   HPI: Carl Davila is a 32 y.o. male who presents to the ED with sore throat and ear pain that started yesterday. Patient reports that his children have been sick with similar symptoms. One child had an ear infection.  ROS: ENT: ear pain, sore throat  Physical Exam:  BP 123/79 (BP Location: Right Arm)   Pulse 91   Temp 98.1 F (36.7 C) (Oral)   Resp 14   SpO2 100%    Gen: No distress  ENT: right TM with erythema and bulging, throat with erythema  Neuro: Awake and Alert  Skin: Warm and dry   Initiation of care has begun. The patient has been counseled on the process, plan, and necessity for staying for the completion/evaluation, and the remainder of the medical screening examination    Janne Napoleon, NP 12/04/17 1341    Tilden Fossa, MD 12/05/17 1558

## 2017-12-04 NOTE — ED Notes (Signed)
Called for room x1 by NT and again by  Patient advocate Lemar.

## 2017-12-04 NOTE — Discharge Instructions (Signed)
Please take Ibuprofen (Advil, motrin) and Tylenol (acetaminophen) to relieve your pain.  You may take up to 600 MG (3 pills) of normal strength ibuprofen every 8 hours as needed.  In between doses of ibuprofen you make take tylenol, up to 1,000 mg (two extra strength pills).  Do not take more than 3,000 mg tylenol in a 24 hour period.  Please check all medication labels as many medications such as pain and cold medications may contain tylenol.  Do not drink alcohol while taking these medications.  Do not take other NSAID'S while taking ibuprofen (such as aleve or naproxen).  Please take ibuprofen with food to decrease stomach upset.  You may try a nasal steroid  spray.

## 2017-12-24 ENCOUNTER — Emergency Department (HOSPITAL_COMMUNITY): Payer: Self-pay

## 2017-12-24 ENCOUNTER — Encounter (HOSPITAL_COMMUNITY): Payer: Self-pay

## 2017-12-24 ENCOUNTER — Emergency Department (HOSPITAL_COMMUNITY)
Admission: EM | Admit: 2017-12-24 | Discharge: 2017-12-25 | Disposition: A | Discharge status: Home / Self Care | Payer: Self-pay | Attending: Emergency Medicine | Admitting: Emergency Medicine

## 2017-12-24 DIAGNOSIS — J020 Streptococcal pharyngitis: Secondary | ICD-10-CM | POA: Insufficient documentation

## 2017-12-24 DIAGNOSIS — Z87891 Personal history of nicotine dependence: Secondary | ICD-10-CM | POA: Insufficient documentation

## 2017-12-24 LAB — BASIC METABOLIC PANEL
ANION GAP: 9 (ref 5–15)
BUN: 11 mg/dL (ref 6–20)
CALCIUM: 9 mg/dL (ref 8.9–10.3)
CO2: 23 mmol/L (ref 22–32)
Chloride: 104 mmol/L (ref 98–111)
Creatinine, Ser: 1.12 mg/dL (ref 0.61–1.24)
GFR calc Af Amer: >60 mL/min (ref >60)
GLUCOSE: 105 mg/dL — AB (ref 70–99)
Potassium: 3.5 mmol/L (ref 3.5–5.1)
SODIUM: 136 mmol/L (ref 135–145)

## 2017-12-24 LAB — CBC WITH DIFFERENTIAL/PLATELET
Abs Immature Granulocytes: 0.08 10*3/uL — ABNORMAL HIGH (ref 0.00–0.07)
BASOS PCT: 0 %
Basophils Absolute: 0.1 10*3/uL (ref 0.0–0.1)
Eosinophils Absolute: 0 10*3/uL (ref 0.0–0.5)
Eosinophils Relative: 0 %
HCT: 45.8 % (ref 39.0–52.0)
Hemoglobin: 14 g/dL (ref 13.0–17.0)
IMMATURE GRANULOCYTES: 0 %
Lymphocytes Relative: 6 %
Lymphs Abs: 1.2 10*3/uL (ref 0.7–4.0)
MCH: 27.3 pg (ref 26.0–34.0)
MCHC: 30.6 g/dL (ref 30.0–36.0)
MCV: 89.3 fL (ref 80.0–100.0)
MONOS PCT: 7 %
Monocytes Absolute: 1.4 10*3/uL — ABNORMAL HIGH (ref 0.1–1.0)
NEUTROS ABS: 17.4 10*3/uL — AB (ref 1.7–7.7)
Neutrophils Relative %: 87 %
PLATELETS: 311 10*3/uL (ref 150–400)
RBC: 5.13 MIL/uL (ref 4.22–5.81)
RDW: 13 % (ref 11.5–15.5)
WBC: 20.1 10*3/uL — AB (ref 4.0–10.5)
nRBC: 0 % (ref 0.0–0.2)

## 2017-12-24 LAB — GROUP A STREP BY PCR: Group A Strep by PCR: DETECTED — AB

## 2017-12-24 MED ORDER — SODIUM CHLORIDE 0.9 % IV BOLUS
500.0000 mL | Freq: Once | INTRAVENOUS | Status: AC
  Administered 2017-12-24: 500 mL via INTRAVENOUS

## 2017-12-24 MED ORDER — IOHEXOL 300 MG/ML  SOLN
75.0000 mL | Freq: Once | INTRAMUSCULAR | Status: AC | PRN
  Administered 2017-12-24: 75 mL via INTRAVENOUS

## 2017-12-24 MED ORDER — DEXAMETHASONE SODIUM PHOSPHATE 10 MG/ML IJ SOLN
10.0000 mg | Freq: Once | INTRAMUSCULAR | Status: AC
  Administered 2017-12-24: 10 mg via INTRAVENOUS
  Filled 2017-12-24: qty 1

## 2017-12-24 MED ORDER — KETOROLAC TROMETHAMINE 15 MG/ML IJ SOLN
15.0000 mg | Freq: Once | INTRAMUSCULAR | Status: AC
  Administered 2017-12-24: 15 mg via INTRAVENOUS
  Filled 2017-12-24: qty 1

## 2017-12-24 NOTE — Discharge Instructions (Addendum)
Please follow up with your primary care provider within 5-7 days for re-evaluation of your symptoms. If you do not have a primary care provider, information for a healthcare clinic has been provided for you to make arrangements for follow up care. Please return to the emergency department for any new or worsening symptoms. ° °

## 2017-12-24 NOTE — ED Notes (Signed)
Pt throat swollen hardly able to talk but no SOB noted on patient.

## 2017-12-24 NOTE — ED Triage Notes (Signed)
Pt present to the ER due to a soar throat that started yestarday. Today has progressed worse not being able to eat or drink. Pt states his fever at home was 103 today and took tylenol around 7 pm. Pt also states he is having ear pain.

## 2017-12-24 NOTE — ED Provider Notes (Signed)
[facility] EMERGENCY DEPARTMENT Provider Note   CSN: [id number] Arrival date & time: 12/24/17  2146     History   Chief Complaint Chief Complaint  Patient presents with  . Sore Throat  . Fever  . Otalgia    HPI Carl Davila is a 32 y.o. male.  HPI   Patient is a 32 year old male with no significant past medical history who presents to the emergency department today for evaluation of a sore throat that began yesterday.  Patient states the pain is constant and severe in nature.  It is worse when talking and swallowing.  States pain is associated with fevers at home (Tmax 103F) as well as bilateral otalgia.  States prior to the onset of his symptoms he had rhinorrhea, congestion and a cough but the symptoms have resolved.  Dates he has not eaten in 2 days because of the pain.  History reviewed. No pertinent past medical history.  There are no active problems to display for this patient.   History reviewed. No pertinent surgical history.      Home Medications    Prior to Admission medications   Medication Sig Start Date End Date Taking? Authorizing Provider  azithromycin (ZITHROMAX Z-PAK) 250 MG tablet 2 po day one, then 1 daily x 4 days 05/31/17   Arthor Captain, PA-C  ibuprofen (ADVIL,MOTRIN) 600 MG tablet Take 1 tablet (600 mg total) every 6 (six) hours as needed by mouth for headache, mild pain, moderate pain or cramping. 12/25/16   Antony Madura, PA-C  promethazine (PHENERGAN) 25 MG tablet Take 1 tablet (25 mg total) every 6 (six) hours as needed by mouth for nausea or vomiting. 12/25/16   Antony Madura, PA-C    Family History History reviewed. No pertinent family history.  Social History Social History   Tobacco Use  . Smoking status: Former Games developer  . Smokeless tobacco: Never Used  Substance Use Topics  . Alcohol use: No  . Drug use: No     Allergies   Patient has no known allergies.   Review of Systems Review of Systems    Constitutional: Positive for fever.       Decreased po intake  HENT: Positive for congestion, ear pain, rhinorrhea (resolved), sore throat and trouble swallowing. Drooling: resolved.   Eyes: Negative for visual disturbance.  Respiratory: Positive for cough (resolved). Negative for shortness of breath.   Cardiovascular: Negative for chest pain.  Gastrointestinal: Negative for abdominal pain, constipation, diarrhea, nausea and vomiting.  Genitourinary: Negative for dysuria.  Musculoskeletal: Negative for back pain.  Skin: Negative for rash.  Neurological: Negative for headaches.     Physical Exam Updated Vital Signs BP 121/80   Pulse 87   Temp 98.5 F (36.9 C) (Oral)   Resp 16   Ht 5\' 8"  (1.727 m)   Wt 59 kg   SpO2 98%   BMI 19.77 kg/m   Physical Exam  Constitutional: He appears well-developed and well-nourished.  Appears uncomfortable  HENT:  Head: Normocephalic and atraumatic.  Right Ear: Tympanic membrane normal. No middle ear effusion.  Left Ear: Tympanic membrane normal.  No middle ear effusion.  Pharyngeal erythema, tonsillar swelling and exudates noted bilaterally.  Right tonsil swollen more than left.  No uvular deviation.  Patient has muffled voice.  He is tolerating secretions.  No trismus.  Eyes: Pupils are equal, round, and reactive to light. Conjunctivae and EOM are normal.  Neck: Neck supple.  Cardiovascular: Normal rate, regular rhythm and intact  distal pulses.  Pulmonary/Chest: Effort normal.  Respirations even and unlabored  Abdominal: Soft. There is no tenderness.  Musculoskeletal: He exhibits no edema.  Lymphadenopathy:    He has cervical adenopathy.  Neurological: He is alert.  Skin: Skin is warm and dry.  Psychiatric: He has a normal mood and affect.  Nursing note and vitals reviewed.  ED Treatments / Results  Labs (all labs ordered are listed, but only abnormal results are displayed) Labs Reviewed  GROUP A STREP BY PCR - Abnormal; Notable for  the following components:      Result Value   Group A Strep by PCR DETECTED (*)    All other components within normal limits  CBC WITH DIFFERENTIAL/PLATELET - Abnormal; Notable for the following components:   WBC 20.1 (*)    Neutro Abs 17.4 (*)    Monocytes Absolute 1.4 (*)    Abs Immature Granulocytes 0.08 (*)    All other components within normal limits  BASIC METABOLIC PANEL - Abnormal; Notable for the following components:   Glucose, Bld 105 (*)    All other components within normal limits    EKG None  Radiology Ct Soft Tissue Neck W Contrast  Result Date: 12/25/2017 CLINICAL DATA:  Initial evaluation for acute sore throat, evaluate for peritonsillar abscess. EXAM: CT NECK WITH CONTRAST TECHNIQUE: Multidetector CT imaging of the neck was performed using the standard protocol following the bolus administration of intravenous contrast. CONTRAST:  75mL OMNIPAQUE IOHEXOL 300 MG/ML  SOLN COMPARISON:  None. FINDINGS: Pharynx and larynx: Oral cavity within normal limits without mass lesion or loculated fluid collection. No acute abnormality about the dentition. Prominent dental carie at the third mandibular molars bilaterally. Probable dentigerous cyst at the left maxillary central incisor. Palatine tonsils are enlarged, hyperenhancing, and edematous in appearance, compatible with acute tonsillitis. Scattered ill-defined hypodensity within the tonsils themselves most consistent with edema. Associated edema and inflammatory stranding within the adjacent tonsillar fossa the and parapharyngeal space without discrete tonsillar or peritonsillar abscess. Mucosal edema within the adjacent oropharynx, compatible with associated pharyngitis, slightly worse on the left. Nasopharynx within normal limits. No retropharyngeal collection. Epiglottis demonstrates no acute inflammatory changes. Vallecula clear. Remainder of the hypopharynx and supraglottic larynx within normal limits. Glottis is closed and not  well assessed. Subglottic airway clear. Salivary glands: Salivary glands including the parotid and submandibular glands are normal. Thyroid: Normal. Lymph nodes: Enlarged bilateral level II lymph nodes measure up to 13 mm on the left and 12 mm on the right, likely reactive. No other pathologically enlarged lymph nodes identified within the neck. Vascular: Normal intravascular enhancement seen throughout the neck. Limited intracranial: Unremarkable. Visualized orbits: Unremarkable. Mastoids and visualized paranasal sinuses: Mild layering opacity within left maxillary sinus. Visualized paranasal sinuses are otherwise clear. Visualize mastoids and middle ear cavities are well pneumatized and free of fluid. Skeleton: No acute osseous abnormality. No discrete lytic or blastic osseous lesions. Upper chest: Visualized upper chest demonstrates no acute finding. Visualized lung apices are clear. Other: None. IMPRESSION: 1. Findings consistent with acute tonsillitis/pharyngitis as above. No discrete tonsillar or peritonsillar abscess identified. 2. Mildly enlarged bilateral level II cervical lymph nodes, likely reactive. 3. Scattered dental caries as above. Nonemergent outpatient dental referral recommended. Electronically Signed   By: Rise Mu M.D.   On: 12/25/2017 00:41    Procedures Procedures (including critical care time)  Medications Ordered in ED Medications  penicillin g benzathine (BICILLIN LA) 1200000 UNIT/2ML injection 1.2 Million Units (has no administration in time range)  dexamethasone (DECADRON) injection 10 mg (10 mg Intravenous Given 12/24/17 2257)  ketorolac (TORADOL) 15 MG/ML injection 15 mg (15 mg Intravenous Given 12/24/17 2257)  sodium chloride 0.9 % bolus 500 mL (500 mLs Intravenous New Bag/Given 12/24/17 2255)  iohexol (OMNIPAQUE) 300 MG/ML solution 75 mL (75 mLs Intravenous Contrast Given 12/24/17 2358)     Initial Impression / Assessment and Plan / ED Course  I have  reviewed the triage vital signs and the nursing notes.  Pertinent labs & imaging results that were available during my care of the patient were reviewed by me and considered in my medical decision making (see chart for details).   Final Clinical Impressions(s) / ED Diagnoses   Final diagnoses:  Strep pharyngitis   Patient presented with 2-day history of sore throat and fevers at home.  On exam has pharyngeal erythema, tonsillar swelling and exudates.  Right tonsil swollen much more than left.  Uvula is midline however patient does have muffled voice.  He is tolerating his secretions and has no trismus on exam.  Will obtain basic labs and CT soft tissue of the neck.  Will give Decadron, Toradol and small fluid bolus and reassess.  Cbc with WBC count of 20, no anemia Bmp reassuring Ct soft tissue neck with no evidence of deep space infection, consistent with acute tonsillitis/pharyngitis. Scattered dental caries. (He reports he has dental f/u in a week) Strep test is positive.   Pt nontoxic and nonseptic appearing. Vitals reassuring and afebrile here. Able to tolerate po and voice improved after decadron. im penicillin given in the Ed. Pt discharge with strict return precautions. He voices an understanding of the plan and reasons to return.   ED Discharge Orders    None       Rayne Du 12/25/17 0107    Shaune Pollack, MD 12/26/17 [phone]

## 2017-12-25 ENCOUNTER — Emergency Department (HOSPITAL_COMMUNITY): Payer: Self-pay

## 2017-12-25 MED ORDER — PENICILLIN G BENZATHINE 1200000 UNIT/2ML IM SUSP
1.2000 10*6.[IU] | Freq: Once | INTRAMUSCULAR | Status: AC
  Administered 2017-12-25: 1.2 10*6.[IU] via INTRAMUSCULAR
  Filled 2017-12-25: qty 2

## 2018-06-04 ENCOUNTER — Emergency Department (HOSPITAL_COMMUNITY)
Admission: EM | Admit: 2018-06-04 | Discharge: 2018-06-04 | Disposition: A | Discharge status: Home / Self Care | Payer: BLUE CROSS/BLUE SHIELD | Attending: Emergency Medicine | Admitting: Emergency Medicine

## 2018-06-04 ENCOUNTER — Other Ambulatory Visit: Payer: Self-pay

## 2018-06-04 DIAGNOSIS — J029 Acute pharyngitis, unspecified: Secondary | ICD-10-CM | POA: Diagnosis not present

## 2018-06-04 DIAGNOSIS — Z79899 Other long term (current) drug therapy: Secondary | ICD-10-CM | POA: Diagnosis not present

## 2018-06-04 DIAGNOSIS — Z87891 Personal history of nicotine dependence: Secondary | ICD-10-CM | POA: Diagnosis not present

## 2018-06-04 DIAGNOSIS — H9203 Otalgia, bilateral: Secondary | ICD-10-CM | POA: Insufficient documentation

## 2018-06-04 LAB — GROUP A STREP BY PCR: Group A Strep by PCR: NOT DETECTED

## 2018-06-04 MED ORDER — ONDANSETRON 4 MG PO TBDP
4.0000 mg | ORAL_TABLET | Freq: Once | ORAL | Status: AC
  Administered 2018-06-04: 13:00:00 4 mg via ORAL
  Filled 2018-06-04: qty 1

## 2018-06-04 MED ORDER — DEXAMETHASONE 4 MG PO TABS
10.0000 mg | ORAL_TABLET | Freq: Once | ORAL | Status: AC
  Administered 2018-06-04: 10 mg via ORAL
  Filled 2018-06-04: qty 3

## 2018-06-04 MED ORDER — ONDANSETRON HCL 4 MG PO TABS: 4.0000 mg | ORAL_TABLET | Freq: Four times a day (QID) | ORAL | 0 refills | Status: AC | PRN

## 2018-06-04 MED ORDER — ACETAMINOPHEN 500 MG PO TABS: 500.0000 mg | ORAL_TABLET | Freq: Four times a day (QID) | ORAL | 0 refills | Status: AC | PRN

## 2018-06-04 MED ORDER — ACETAMINOPHEN 325 MG PO TABS
650.0000 mg | ORAL_TABLET | Freq: Once | ORAL | Status: AC
  Administered 2018-06-04: 650 mg via ORAL
  Filled 2018-06-04: qty 2

## 2018-06-04 MED ORDER — IBUPROFEN 600 MG PO TABS: 600.0000 mg | ORAL_TABLET | Freq: Four times a day (QID) | ORAL | 0 refills | Status: DC | PRN

## 2018-06-04 NOTE — ED Triage Notes (Signed)
Pt states 1 hour ago while at work his co worker went home due to N/V. Immediately afterwards pt began to have throat pain and bilateral ear pain. P[t states his throat feels swollen. Pt denies any fevers, cough or cp.

## 2018-06-04 NOTE — Discharge Instructions (Addendum)
Alternate with ibuprofen and Tylenol as prescribed over-the-counter, as needed for sore throat.  You can take Tessalon every 8 hours as needed for cough.  You can take Zofran every 6 hours as needed for nausea or vomiting.  Make sure to get plenty of rest and drink plenty of fluids.  Do not return to work until your symptoms have resolved for 3 days.  Please return to the emergency department if you develop any new or worsening symptoms including localized abdominal pain, intractable vomiting, severe shortness of breath, or any other new or concerning symptoms.

## 2018-06-04 NOTE — ED Notes (Signed)
Patient verbalizes understanding of discharge instructions. Opportunity for questioning and answers were provided. Armband removed by staff, pt discharged from ED. Prescriptions and pharmacy reviewed. Pt ambulatory to lobby.  

## 2018-06-04 NOTE — ED Provider Notes (Addendum)
[facility] Provider Note   CSN: [id number] Arrival date & time: 06/04/18  1221    History   Chief Complaint Chief Complaint  Patient presents with  . Sore Throat  . Otalgia    HPI Carl Davila is a 33 y.o. male who is previously healthy who presents with sore throat and bilateral ear pain as well as an infrequent dry cough that began this morning.  Patient has had associated nausea, but no vomiting.  He denies any chest pain, shortness of breath, abdominal pain, diarrhea.  He reports his symptoms started after he was around a coworker who had nausea and vomiting.  Patient was sent home from work when his symptoms started.  He has not taken any medications at home for symptoms.     HPI  No past medical history on file.  There are no active problems to display for this patient.   No past surgical history on file.      Home Medications    Prior to Admission medications   Medication Sig Start Date End Date Taking? Authorizing Provider  acetaminophen (TYLENOL) 500 MG tablet Take 1 tablet (500 mg total) by mouth every 6 (six) hours as needed. 06/04/18   Lennyn Gange, Waylan BogaAlexandra M, PA-C  azithromycin (ZITHROMAX Z-PAK) 250 MG tablet 2 po day one, then 1 daily x 4 days 05/31/17   Arthor CaptainHarris, Abigail, PA-C  ibuprofen (ADVIL) 600 MG tablet Take 1 tablet (600 mg total) by mouth every 6 (six) hours as needed. 06/04/18   Zaylen Susman, Waylan BogaAlexandra M, PA-C  ondansetron (ZOFRAN) 4 MG tablet Take 1 tablet (4 mg total) by mouth every 6 (six) hours as needed for nausea or vomiting. 06/04/18   Jahnaya Branscome, Waylan BogaAlexandra M, PA-C  promethazine (PHENERGAN) 25 MG tablet Take 1 tablet (25 mg total) every 6 (six) hours as needed by mouth for nausea or vomiting. 12/25/16   Antony MaduraHumes, Kelly, PA-C    Family History No family history on file.  Social History Social History   Tobacco Use  . Smoking status: Former Games developermoker  . Smokeless tobacco: Never Used  Substance Use Topics  . Alcohol use: No  .  Drug use: No     Allergies   Patient has no known allergies.   Review of Systems Review of Systems  Constitutional: Negative for fever.  HENT: Positive for ear pain and sore throat. Negative for congestion.   Respiratory: Positive for cough (dry, infrequent). Negative for shortness of breath.   Cardiovascular: Negative for chest pain.  Gastrointestinal: Positive for nausea. Negative for abdominal pain, diarrhea and vomiting.     Physical Exam Updated Vital Signs BP 127/86 (BP Location: Right Arm)   Pulse 86   Temp 98.6 F (37 C) (Oral)   Resp 18   SpO2 99%   Physical Exam Vitals signs and nursing note reviewed.  Constitutional:      General: He is not in acute distress.    Appearance: He is well-developed. He is not diaphoretic.  HENT:     Head: Normocephalic and atraumatic.     Right Ear: Tympanic membrane normal.     Left Ear: Tympanic membrane normal.     Mouth/Throat:     Mouth: Mucous membranes are moist.     Pharynx: No oropharyngeal exudate.     Tonsils: No tonsillar exudate or tonsillar abscesses. 2+ on the right. 2+ on the left.  Eyes:     General: No scleral icterus.  Right eye: No discharge.        Left eye: No discharge.     Conjunctiva/sclera: Conjunctivae normal.     Pupils: Pupils are equal, round, and reactive to light.  Neck:     Musculoskeletal: Normal range of motion and neck supple.     Thyroid: No thyromegaly.  Cardiovascular:     Rate and Rhythm: Normal rate and regular rhythm.     Heart sounds: Normal heart sounds. No murmur. No friction rub. No gallop.   Pulmonary:     Effort: Pulmonary effort is normal. No respiratory distress.     Breath sounds: Normal breath sounds. No stridor. No wheezing or rales.  Abdominal:     General: Bowel sounds are normal. There is no distension.     Palpations: Abdomen is soft.     Tenderness: There is no abdominal tenderness. There is no guarding or rebound.  Lymphadenopathy:     Cervical: No  cervical adenopathy.  Skin:    General: Skin is warm and dry.     Coloration: Skin is not pale.     Findings: No rash.  Neurological:     Mental Status: He is alert.     Coordination: Coordination normal.      ED Treatments / Results  Labs (all labs ordered are listed, but only abnormal results are displayed) Labs Reviewed  GROUP A STREP BY PCR    EKG None  Radiology No results found.  Procedures Procedures (including critical care time)  Medications Ordered in ED Medications  acetaminophen (TYLENOL) tablet 650 mg (650 mg Oral Given 06/04/18 1319)  dexamethasone (DECADRON) tablet 10 mg (10 mg Oral Given 06/04/18 1319)  ondansetron (ZOFRAN-ODT) disintegrating tablet 4 mg (4 mg Oral Given 06/04/18 1319)     Initial Impression / Assessment and Plan / ED Course  I have reviewed the triage vital signs and the nursing notes.  Pertinent labs & imaging results that were available during my care of the patient were reviewed by me and considered in my medical decision making (see chart for details).        Patient with probable viral pharyngitis.  Lungs are clear.  No indication for chest x-ray at this time.  No signs of peritonsillar abscess.  Symptoms have only been present for a few hours.  Strep PCR is negative.  Will treat symptomatically with single dose Decadron in the ED as well as Tylenol and Zofran.  Discharge home with ibuprofen, Tylenol, Tessalon, and Zofran.  Return precautions discussed.  Patient understands and agrees with plan.  Patient vitals stable her ED course and discharged in satisfactory condition.  Carl Davila was evaluated in Emergency Department on 06/04/2018 for the symptoms described in the history of present illness. He was evaluated in the context of the global COVID-19 pandemic, which necessitated consideration that the patient might be at risk for infection with the SARS-CoV-2 virus that causes COVID-19. Institutional protocols and algorithms that  pertain to the evaluation of patients at risk for COVID-19 are in a state of rapid change based on information released by regulatory bodies including the CDC and federal and state organizations. These policies and algorithms were followed during the patient's care in the ED.   Final Clinical Impressions(s) / ED Diagnoses   Final diagnoses:  Viral pharyngitis    ED Discharge Orders         Ordered    ibuprofen (ADVIL) 600 MG tablet  Every 6 hours PRN  06/04/18 1421    acetaminophen (TYLENOL) 500 MG tablet  Every 6 hours PRN     06/04/18 1421    ondansetron (ZOFRAN) 4 MG tablet  Every 6 hours PRN     06/04/18 [address] PA-C 06/04/18 1423    Azalia Bilis, MD 06/04/18 1447

## 2018-09-12 ENCOUNTER — Other Ambulatory Visit: Payer: Self-pay

## 2018-09-12 DIAGNOSIS — Z20822 Contact with and (suspected) exposure to covid-19: Secondary | ICD-10-CM

## 2018-09-14 LAB — NOVEL CORONAVIRUS, NAA: SARS-CoV-2, NAA: NOT DETECTED

## 2018-10-29 ENCOUNTER — Encounter (HOSPITAL_COMMUNITY): Payer: Self-pay | Admitting: Emergency Medicine

## 2018-10-29 ENCOUNTER — Emergency Department (HOSPITAL_COMMUNITY)
Admission: EM | Admit: 2018-10-29 | Discharge: 2018-10-29 | Disposition: A | Discharge status: Home / Self Care | Payer: BLUE CROSS/BLUE SHIELD | Attending: Emergency Medicine | Admitting: Emergency Medicine

## 2018-10-29 DIAGNOSIS — H1032 Unspecified acute conjunctivitis, left eye: Secondary | ICD-10-CM

## 2018-10-29 DIAGNOSIS — Z79899 Other long term (current) drug therapy: Secondary | ICD-10-CM | POA: Insufficient documentation

## 2018-10-29 DIAGNOSIS — Z87891 Personal history of nicotine dependence: Secondary | ICD-10-CM | POA: Insufficient documentation

## 2018-10-29 DIAGNOSIS — H10022 Other mucopurulent conjunctivitis, left eye: Secondary | ICD-10-CM | POA: Insufficient documentation

## 2018-10-29 MED ORDER — CIPROFLOXACIN HCL 0.3 % OP OINT: TOPICAL_OINTMENT | OPHTHALMIC | 0 refills | Status: DC

## 2018-10-29 MED ORDER — TETRACAINE HCL 0.5 % OP SOLN
2.0000 [drp] | Freq: Once | OPHTHALMIC | Status: AC
  Administered 2018-10-29: 2 [drp] via OPHTHALMIC
  Filled 2018-10-29: qty 4

## 2018-10-29 MED ORDER — FLUORESCEIN SODIUM 1 MG OP STRP
1.0000 | ORAL_STRIP | Freq: Once | OPHTHALMIC | Status: AC
  Administered 2018-10-29: 1 via OPHTHALMIC
  Filled 2018-10-29: qty 1

## 2018-10-29 NOTE — ED Triage Notes (Signed)
Pt here with c/o left eye pain , thinks he may have got something in it at work , pt does wear contacts but took them out yesterday

## 2018-10-29 NOTE — ED Notes (Signed)
Pt verbalized understanding of discharge instructions, no further questions at this time. 

## 2018-10-29 NOTE — Discharge Instructions (Signed)
Please read attached information. If you experience any new or worsening signs or symptoms please return to the emergency room for evaluation. Please follow-up with your primary care provider or specialist as discussed. Please use medication prescribed only as directed and discontinue taking if you have any concerning signs or symptoms.   °

## 2018-10-29 NOTE — ED Provider Notes (Signed)
[facility] EMERGENCY DEPARTMENT Provider Note   CSN: [id number] Arrival date & time: 10/29/18  1634     History   Chief Complaint No chief complaint on file.   HPI Carl Davila is a 33 y.o. male.     HPI   33 year old male presents today with complaints of left eye irritation.  Patient notes wears contacts, he notes these are supposed to be exchanged every month.  He has been wearing the current site for approximately 3 weeks.  He notes this morning he felt some irritation the left eye, he removed his contacts and noted swelling to the eye.  He notes the symptoms have dramatically improved, still notes some light sensitivity.  He denies any known foreign bodies, no infectious symptoms.  He notes his vision was changed originally, but after removing the contact his vision was equal bilateral and is normal with his glasses on.    No past medical history on file.  There are no active problems to display for this patient.   No past surgical history on file.      Home Medications    Prior to Admission medications   Medication Sig Start Date End Date Taking? Authorizing Provider  acetaminophen (TYLENOL) 500 MG tablet Take 1 tablet (500 mg total) by mouth every 6 (six) hours as needed. 06/04/18   Law, Bea Graff, PA-C  azithromycin (ZITHROMAX Z-PAK) 250 MG tablet 2 po day one, then 1 daily x 4 days 05/31/17   Margarita Mail, PA-C  ciprofloxacin (CILOXAN) 0.3 % ophthalmic ointment Apply 1/2 inch ribbon to eye 3 times per day for 7 days 10/29/18   Airis Barbee, Dellis Filbert, PA-C  ibuprofen (ADVIL) 600 MG tablet Take 1 tablet (600 mg total) by mouth every 6 (six) hours as needed. 06/04/18   Law, Bea Graff, PA-C  ondansetron (ZOFRAN) 4 MG tablet Take 1 tablet (4 mg total) by mouth every 6 (six) hours as needed for nausea or vomiting. 06/04/18   Law, Bea Graff, PA-C  promethazine (PHENERGAN) 25 MG tablet Take 1 tablet (25 mg total) every 6 (six) hours as needed by mouth  for nausea or vomiting. 12/25/16   Antonietta Breach, PA-C    Family History No family history on file.  Social History Social History   Tobacco Use  . Smoking status: Former Research scientist (life sciences)  . Smokeless tobacco: Never Used  Substance Use Topics  . Alcohol use: No  . Drug use: No     Allergies   Patient has no known allergies.   Review of Systems Review of Systems  All other systems reviewed and are negative.    Physical Exam Updated Vital Signs BP 117/81 (BP Location: Left Arm)   Pulse 64   Temp 98.6 F (37 C) (Oral)   Resp 16   SpO2 99%   Physical Exam Vitals signs and nursing note reviewed.  Constitutional:      Appearance: He is well-developed.  HENT:     Head: Normocephalic and atraumatic.     Comments: Faint conjunctival injection noted to the left bulbar conjunctive a, no discharge, pupils equal round reactive to light extraocular movements intact and pain-free, fluorescein shows no uptake, no obvious corneal abrasions, ulcerations, dendritic lesions-slit-lamp is not working at [employer] unable to do slit-lamp exam Eyes:     General: No scleral icterus.       Right eye: No discharge.        Left eye: No discharge.     Conjunctiva/sclera: Conjunctivae  normal.     Pupils: Pupils are equal, round, and reactive to light.  Neck:     Musculoskeletal: Normal range of motion.     Vascular: No JVD.     Trachea: No tracheal deviation.  Pulmonary:     Effort: Pulmonary effort is normal.     Breath sounds: No stridor.  Neurological:     Mental Status: He is alert and oriented to person, place, and time.     Coordination: Coordination normal.  Psychiatric:        Behavior: Behavior normal.        Thought Content: Thought content normal.        Judgment: Judgment normal.      ED Treatments / Results  Labs (all labs ordered are listed, but only abnormal results are displayed) Labs Reviewed - No data to display  EKG None  Radiology No results found.   Procedures Procedures (including critical care time)  Medications Ordered in ED Medications  fluorescein ophthalmic strip 1 strip (1 strip Both Eyes Given by Other 10/29/18 1820)  tetracaine (PONTOCAINE) 0.5 % ophthalmic solution 2 drop (2 drops Both Eyes Given by Other 10/29/18 1819)     Initial Impression / Assessment and Plan / ED Course  I have reviewed the triage vital signs and the nursing notes.  Pertinent labs & imaging results that were available during my care of the patient were reviewed by me and considered in my medical decision making (see chart for details).           33 year old male presents today with likely conjunctivitis.  He is a contact lens wearer.  No signs of ulceration, abrasions or severe infection.  Patient will be placed on ophthalmic medication he will follow-up in 2 days if symptoms have not improved with ophthalmology.  Return precautions given.  Verbalized understanding and agreement to today's plan.  Final Clinical Impressions(s) / ED Diagnoses   Final diagnoses:  Acute bacterial conjunctivitis of left eye    ED Discharge Orders         Ordered    ciprofloxacin (CILOXAN) 0.3 % ophthalmic ointment     10/29/18 1844           Eyvonne MechanicHedges, Marquett Bertoli, PA-C 10/30/18 1025    Sabas SousBero, Michael M, MD 10/30/18 2348

## 2019-03-11 ENCOUNTER — Encounter (HOSPITAL_COMMUNITY): Payer: Self-pay | Admitting: Emergency Medicine

## 2019-03-11 ENCOUNTER — Emergency Department (HOSPITAL_COMMUNITY): Payer: Self-pay

## 2019-03-11 ENCOUNTER — Other Ambulatory Visit: Payer: Self-pay

## 2019-03-11 ENCOUNTER — Emergency Department (HOSPITAL_COMMUNITY)
Admission: EM | Admit: 2019-03-11 | Discharge: 2019-03-11 | Disposition: A | Discharge status: Home / Self Care | Payer: Self-pay | Attending: Emergency Medicine | Admitting: Emergency Medicine

## 2019-03-11 DIAGNOSIS — B349 Viral infection, unspecified: Secondary | ICD-10-CM | POA: Insufficient documentation

## 2019-03-11 DIAGNOSIS — Z20822 Contact with and (suspected) exposure to covid-19: Secondary | ICD-10-CM | POA: Insufficient documentation

## 2019-03-11 DIAGNOSIS — Z87891 Personal history of nicotine dependence: Secondary | ICD-10-CM | POA: Insufficient documentation

## 2019-03-11 LAB — GROUP A STREP BY PCR: Group A Strep by PCR: NOT DETECTED

## 2019-03-11 MED ORDER — NAPROXEN 500 MG PO TABS: 500.0000 mg | ORAL_TABLET | Freq: Two times a day (BID) | ORAL | 0 refills | Status: DC

## 2019-03-11 MED ORDER — ALBUTEROL SULFATE HFA 108 (90 BASE) MCG/ACT IN AERS: 1.0000 | INHALATION_SPRAY | Freq: Four times a day (QID) | RESPIRATORY_TRACT | 0 refills | Status: AC | PRN

## 2019-03-11 MED ORDER — DEXAMETHASONE SODIUM PHOSPHATE 10 MG/ML IJ SOLN
10.0000 mg | Freq: Once | INTRAMUSCULAR | Status: AC
  Administered 2019-03-11: 20:00:00 10 mg via INTRAMUSCULAR
  Filled 2019-03-11: qty 1

## 2019-03-11 MED ORDER — FLUTICASONE PROPIONATE 50 MCG/ACT NA SUSP: 1.0000 | Freq: Every day | NASAL | 0 refills | Status: AC

## 2019-03-11 MED ORDER — DEXAMETHASONE SODIUM PHOSPHATE 10 MG/ML IJ SOLN: 10.0000 mg | Freq: Once | INTRAMUSCULAR | Status: DC

## 2019-03-11 MED ORDER — BENZONATATE 100 MG PO CAPS: 100.0000 mg | ORAL_CAPSULE | Freq: Three times a day (TID) | ORAL | 0 refills | Status: DC | PRN

## 2019-03-11 MED ORDER — NAPROXEN 250 MG PO TABS
500.0000 mg | ORAL_TABLET | Freq: Once | ORAL | Status: AC
  Administered 2019-03-11: 500 mg via ORAL
  Filled 2019-03-11: qty 2

## 2019-03-11 NOTE — ED Notes (Signed)
Decadron given per MAR. Name/DOB verified with pt

## 2019-03-11 NOTE — ED Provider Notes (Signed)
[facility] EMERGENCY DEPARTMENT Provider Note   CSN: [id number] Arrival date & time: 03/11/19  1714     History Chief Complaint  Patient presents with  . Generalized Body Aches    Carl Davila is a 34 y.o. male with a history of tobacco abuse who presents to the ED with complaints of generally not feeling well for the past 4-5 days. Patient reports nasal congestion, ear pain, sore throat, dry cough, trouble breathing, body aches, subjective fever, and chills. Sxs constant, no alleviating/aggravating factors. No intervention PTA. Denies known covid 19 exposure. Denies chest pain, abdominal pain, emesis, unilateral leg pain/swelling, hemoptysis, recent surgery/trauma, recent long travel, hormone use, personal hx of cancer, or hx of DVT/PE.       HPI     History reviewed. No pertinent past medical history.  There are no problems to display for this patient.   History reviewed. No pertinent surgical history.     No family history on file.  Social History   Tobacco Use  . Smoking status: Former Games developer  . Smokeless tobacco: Never Used  Substance Use Topics  . Alcohol use: No  . Drug use: No    Home Medications Prior to Admission medications   Medication Sig Start Date End Date Taking? Authorizing Provider  acetaminophen (TYLENOL) 500 MG tablet Take 1 tablet (500 mg total) by mouth every 6 (six) hours as needed. 06/04/18   Law, Waylan Boga, PA-C  azithromycin (ZITHROMAX Z-PAK) 250 MG tablet 2 po day one, then 1 daily x 4 days 05/31/17   Arthor Captain, PA-C  ciprofloxacin (CILOXAN) 0.3 % ophthalmic ointment Apply 1/2 inch ribbon to eye 3 times per day for 7 days 10/29/18   Hedges, Tinnie Gens, PA-C  ibuprofen (ADVIL) 600 MG tablet Take 1 tablet (600 mg total) by mouth every 6 (six) hours as needed. 06/04/18   Law, Waylan Boga, PA-C  ondansetron (ZOFRAN) 4 MG tablet Take 1 tablet (4 mg total) by mouth every 6 (six) hours as needed for nausea or vomiting.  06/04/18   Law, Waylan Boga, PA-C  promethazine (PHENERGAN) 25 MG tablet Take 1 tablet (25 mg total) every 6 (six) hours as needed by mouth for nausea or vomiting. 12/25/16   Antony Madura, PA-C    Allergies    Patient has no known allergies.  Review of Systems   Review of Systems  Constitutional: Positive for chills and fever.  HENT: Positive for congestion, ear pain and sore throat.   Respiratory: Positive for cough and shortness of breath.   Cardiovascular: Negative for chest pain and leg swelling.  Gastrointestinal: Negative for abdominal pain and vomiting.  Genitourinary: Negative for dysuria.  Musculoskeletal: Positive for arthralgias (generalized) and myalgias (generalized).  Neurological: Negative for syncope.    Physical Exam Updated Vital Signs BP 118/80 (BP Location: Right Arm)   Pulse 77   Temp 98.6 F (37 C) (Oral)   Resp 18   SpO2 98%   Physical Exam Vitals and nursing note reviewed.  Constitutional:      General: He is not in acute distress.    Appearance: He is well-developed. He is not toxic-appearing.  HENT:     Head: Normocephalic and atraumatic.     Right Ear: Ear canal normal. Tympanic membrane is not perforated, erythematous, retracted or bulging.     Left Ear: Ear canal normal. Tympanic membrane is not perforated, erythematous, retracted or bulging.     Ears:     Comments: No mastoid erythema/swellng/tenderness.  Nose:     Right Sinus: No maxillary sinus tenderness or frontal sinus tenderness.     Left Sinus: No maxillary sinus tenderness or frontal sinus tenderness.     Mouth/Throat:     Pharynx: Oropharynx is clear. Uvula midline. No oropharyngeal exudate or posterior oropharyngeal erythema.     Comments: Posterior oropharynx is symmetric appearing. Patient tolerating own secretions without difficulty. No trismus. No drooling. No hot potato voice. No swelling beneath the tongue, submandibular compartment is soft.  Eyes:     General:         Right eye: No discharge.        Left eye: No discharge.     Conjunctiva/sclera: Conjunctivae normal.  Cardiovascular:     Rate and Rhythm: Normal rate and regular rhythm.  Pulmonary:     Effort: Pulmonary effort is normal. No respiratory distress.     Breath sounds: Normal breath sounds. No wheezing, rhonchi or rales.  Abdominal:     General: There is no distension.     Palpations: Abdomen is soft.     Tenderness: There is no abdominal tenderness. There is no guarding or rebound.  Musculoskeletal:        General: No tenderness.     Cervical back: Neck supple. No rigidity.     Right lower leg: No edema.     Left lower leg: No edema.  Lymphadenopathy:     Cervical: No cervical adenopathy.  Skin:    General: Skin is warm and dry.     Findings: No rash.  Neurological:     Mental Status: He is alert.  Psychiatric:        Behavior: Behavior normal.     ED Results / Procedures / Treatments   Labs (all labs ordered are listed, but only abnormal results are displayed) Labs Reviewed  GROUP A STREP BY PCR  NOVEL CORONAVIRUS, NAA (HOSP ORDER, SEND-OUT TO REF LAB; TAT 18-24 HRS)    EKG EKG Interpretation  Date/Time:  Monday March 11 2019 17:24:16 EST Ventricular Rate:  81 PR Interval:  240 QRS Duration: 94 QT Interval:  350 QTC Calculation: 406 R Axis:   79 Text Interpretation: Sinus rhythm with 1st degree A-V block Otherwise normal ECG No previous tracing Confirmed by Gwyneth Sprout ([id number]) on 03/11/2019 7:03:26 PM   Radiology DG Chest Port 1 View  Result Date: 03/11/2019 CLINICAL DATA:  Shortness of breath, fever, sore throat and generalized body aches x2 days. EXAM: PORTABLE CHEST 1 VIEW COMPARISON:  April 01, 2013 FINDINGS: The heart size and mediastinal contours are within normal limits. Both lungs are clear. The visualized skeletal structures are unremarkable. IMPRESSION: No active disease. Electronically Signed   By: Aram Candela M.D.   On: 03/11/2019 18:30      Procedures Procedures (including critical care time)  Medications Ordered in ED Medications - No data to display  ED Course  I have reviewed the triage vital signs and the nursing notes.  Pertinent labs & imaging results that were available during my care of the patient were reviewed by me and considered in my medical decision making (see chart for details).  Slayter Moorhouse Tomkins was evaluated in Emergency Department on 03/11/2019 for the symptoms described in the history of present illness. He/she was evaluated in the context of the global COVID-19 pandemic, which necessitated consideration that the patient might be at risk for infection with the SARS-CoV-2 virus that causes COVID-19. Institutional protocols and algorithms that pertain to the evaluation of  patients at risk for COVID-19 are in a state of rapid change based on information released by regulatory bodies including the CDC and federal and state organizations. These policies and algorithms were followed during the patient's care in the ED.    MDM Rules/Calculators/A&P                      Patient presents to the ED with complaints of generally not feeling well with URI/LRI sxs, body aches, fever, & chills. Nontoxic appearing, vitals WNL. No signs of AOM/AOE/mastoiditis. Sxs < 10 days, no sinus tenderness, afebrile in the ED- do not suspect acute bacterial sinusitis. Strep negative. No signs of RPA/PTA. No meningismus. Lungs CTA, CXR without infiltrate- do not suspect acute bacterial pneumonia. PERC negative, doubt PE, EKG without concerning ischemic changes. Likely viral, influenza testing deferred secondary to > 48 hours since onset of sxs, and covid outpatient testing sent. Discussed need for quarantine. Will tx supportively. I discussed results, treatment plan, need for follow-up, and return precautions with the patient. Provided opportunity for questions, patient confirmed understanding and is in agreement with plan.   Final  Clinical Impression(s) / ED Diagnoses Final diagnoses:  Viral illness    Rx / DC Orders ED Discharge Orders         Ordered    fluticasone (FLONASE) 50 MCG/ACT nasal spray  Daily     03/11/19 1906    naproxen (NAPROSYN) 500 MG tablet  2 times daily     03/11/19 1906    albuterol (VENTOLIN HFA) 108 (90 Base) MCG/ACT inhaler  Every 6 hours PRN     03/11/19 1906    benzonatate (TESSALON) 100 MG capsule  3 times daily PRN     03/11/19 1906           Leafy Kindle 03/11/19 1910    Blanchie Dessert, MD 03/11/19 2119

## 2019-03-11 NOTE — ED Notes (Addendum)
Naproxen given per MAR. Name/DOB verified with pt. Pt asking "is this stronger than ibuprofen?" education provided. Pt stating "I need to speak with the doctor. Y'all didn't do anything for me. My throat hurts!" PA notified

## 2019-03-11 NOTE — ED Triage Notes (Signed)
Pt c/o shortness of breath, fevers, sore throat and generalized body aches x 2 days. Unknown covid contacts. Afebrile in triage.

## 2019-03-11 NOTE — Discharge Instructions (Signed)
You were seen in the emergency department today for body aches, sore throat, cough, and trouble breathing.  Your chest x-ray was normal.  Your strep swab was negative.  Your EKG did not show signs of a heart attack-you do have signs of what is called a first-degree AV block, this is a somewhat abnormal interval in your heart rhythm, something to discuss a follow-up appoint with your primary care provider.  We suspect your symptoms are related to a virus, possibly COVID-19.  We are sending you home with the following medicines: -Flonase: Use 1 spray per nostril daily to help with nasal congestion -Tessalon: Use 1 tablet every 8 hours as needed for coughing -Albuterol inhaler: Use 1 to 2 puffs every 4-6 hours as needed for trouble breathing/wheezing -Naproxen: this is a nonsteroidal anti-inflammatory medication that will help with pain and swelling. Be sure to take this medication as prescribed with food, 1 pill every 12 hours,  It should be taken with food, as it can cause stomach upset, and more seriously, stomach bleeding. Do not take other nonsteroidal anti-inflammatory medications with this such as Advil, Motrin, Aleve, Mobic, Goodie Powder, or Motrin.    You make take Tylenol per over the counter dosing with these medications.   We have prescribed you new medication(s) today. Discuss the medications prescribed today with your pharmacist as they can have adverse effects and interactions with your other medicines including over the counter and prescribed medications. Seek medical evaluation if you start to experience new or abnormal symptoms after taking one of these medicines, seek care immediately if you start to experience difficulty breathing, feeling of your throat closing, facial swelling, or rash as these could be indications of a more serious allergic reaction   We have tested you for COVID 19, we will call you within the next 72 hours if results are positive, you may also view these results  on MyChart.   We are instructing patient's with COVID 19 or symptoms of COVID 19 to quarantine themselves for 14 days. You may be able to discontinue self quarantine if the following conditions are met:   Persons with COVID-19 who have symptoms and were directed to care for themselves at home may discontinue home isolation under the  following conditions: - It has been at least 7 days have passed since symptoms first appeared. - AND at least 3 days (72 hours) have passed since recovery defined as resolution of fever without the use of fever-reducing medications and improvement in respiratory symptoms (e.g., cough, shortness of breath)  Please follow the below quarantine instructions.   Please follow up with primary care within 3-5 days for re-evaluation- call prior to going to the office to make them aware of your symptoms as some offices are altering their method of seeing patients with COVID 19 symptoms. Return to the ER for new or worsening symptoms including but not limited to increased work of breathing, chest pain, passing out, or any other concerns.       Person Under Monitoring Name: Carl Davila  Location: [address] [place] [address]   Infection Prevention Recommendations for Individuals Confirmed to have, or Being Evaluated for, 2019 Novel Coronavirus (COVID-19) Infection Who Receive Care at Home  Individuals who are confirmed to have, or are being evaluated for, COVID-19 should follow the prevention steps below until a healthcare provider or local or state health department says they can return to normal activities.  Stay home except to get medical care You should  restrict activities outside your home, except for getting medical care. Do not go to work, school, or public areas, and do not use public transportation or taxis.  Call ahead before visiting your doctor Before your medical appointment, call the healthcare provider and tell them that you have, or are  being evaluated for, COVID-19 infection. This will help the healthcare providers office take steps to keep other people from getting infected. Ask your healthcare provider to call the local or state health department.  Monitor your symptoms Seek prompt medical attention if your illness is worsening (e.g., difficulty breathing). Before going to your medical appointment, call the healthcare provider and tell them that you have, or are being evaluated for, COVID-19 infection. Ask your healthcare provider to call the local or state health department.  Wear a facemask You should wear a facemask that covers your nose and mouth when you are in the same room with other people and when you visit a healthcare provider. People who live with or visit you should also wear a facemask while they are in the same room with you.  Separate yourself from other people in your home As much as possible, you should stay in a different room from other people in your home. Also, you should use a separate bathroom, if available.  Avoid sharing household items You should not share dishes, drinking glasses, cups, eating utensils, towels, bedding, or other items with other people in your home. After using these items, you should wash them thoroughly with soap and water.  Cover your coughs and sneezes Cover your mouth and nose with a tissue when you cough or sneeze, or you can cough or sneeze into your sleeve. Throw used tissues in a lined trash can, and immediately wash your hands with soap and water for at least 20 seconds or use an alcohol-based hand rub.  Wash your [facility] your hands often and thoroughly with soap and water for at least 20 seconds. You can use an alcohol-based hand sanitizer if soap and water are not available and if your hands are not visibly dirty. Avoid touching your eyes, nose, and mouth with unwashed hands.   Prevention Steps for Caregivers and Household Members of Individuals  Confirmed to have, or Being Evaluated for, COVID-19 Infection Being Cared for in the Home  If you live with, or provide care at home for, a person confirmed to have, or being evaluated for, COVID-19 infection please follow these guidelines to prevent infection:  Follow healthcare providers instructions Make sure that you understand and can help the patient follow any healthcare provider instructions for all care.  Provide for the patients basic needs You should help the patient with basic needs in the home and provide support for getting groceries, prescriptions, and other personal needs.  Monitor the patients symptoms If they are getting sicker, call his or her medical provider and tell them that the patient has, or is being evaluated for, COVID-19 infection. This will help the healthcare providers office take steps to keep other people from getting infected. Ask the healthcare provider to call the local or state health department.  Limit the number of people who have contact with the patient If possible, have only one caregiver for the patient. Other household members should stay in another home or place of residence. If this is not possible, they should stay in another room, or be separated from the patient as much as possible. Use a separate bathroom, if available. Restrict visitors who do not  have an essential need to be in the home.  Keep older adults, very young children, and other sick people away from the patient Keep older adults, very young children, and those who have compromised immune systems or chronic health conditions away from the patient. This includes people with chronic heart, lung, or kidney conditions, diabetes, and cancer.  Ensure good ventilation Make sure that shared spaces in the home have good air flow, such as from an air conditioner or an opened window, weather permitting.  Wash your hands often Wash your hands often and thoroughly with soap and water  for at least 20 seconds. You can use an alcohol based hand sanitizer if soap and water are not available and if your hands are not visibly dirty. Avoid touching your eyes, nose, and mouth with unwashed hands. Use disposable paper towels to dry your hands. If not available, use dedicated cloth towels and replace them when they become wet.  Wear a facemask and gloves Wear a disposable facemask at all times in the room and gloves when you touch or have contact with the patients blood, body fluids, and/or secretions or excretions, such as sweat, saliva, sputum, nasal mucus, vomit, urine, or feces.  Ensure the mask fits over your nose and mouth tightly, and do not touch it during use. Throw out disposable facemasks and gloves after using them. Do not reuse. Wash your hands immediately after removing your facemask and gloves. If your personal clothing becomes contaminated, carefully remove clothing and launder. Wash your hands after handling contaminated clothing. Place all used disposable facemasks, gloves, and other waste in a lined container before disposing them with other household waste. Remove gloves and wash your hands immediately after handling these items.  Do not share dishes, glasses, or other household items with the patient Avoid sharing household items. You should not share dishes, drinking glasses, cups, eating utensils, towels, bedding, or other items with a patient who is confirmed to have, or being evaluated for, COVID-19 infection. After the person uses these items, you should wash them thoroughly with soap and water.  Wash laundry thoroughly Immediately remove and wash clothes or bedding that have blood, body fluids, and/or secretions or excretions, such as sweat, saliva, sputum, nasal mucus, vomit, urine, or feces, on them. Wear gloves when handling laundry from the patient. Read and follow directions on labels of laundry or clothing items and detergent. In general, wash and dry  with the warmest temperatures recommended on the label.  Clean all areas the individual has used often Clean all touchable surfaces, such as counters, tabletops, doorknobs, bathroom fixtures, toilets, phones, keyboards, tablets, and bedside tables, every day. Also, clean any surfaces that may have blood, body fluids, and/or secretions or excretions on them. Wear gloves when cleaning surfaces the patient has come in contact with. Use a diluted bleach solution (e.g., dilute bleach with 1 part bleach and 10 parts water) or a household disinfectant with a label that says EPA-registered for coronaviruses. To make a bleach solution at home, add 1 tablespoon of bleach to 1 quart (4 cups) of water. For a larger supply, add  cup of bleach to 1 gallon (16 cups) of water. Read labels of cleaning products and follow recommendations provided on product labels. Labels contain instructions for safe and effective use of the cleaning product including precautions you should take when applying the product, such as wearing gloves or eye protection and making sure you have good ventilation during use of the product. Remove gloves and  wash hands immediately after cleaning.  Monitor yourself for signs and symptoms of illness Caregivers and household members are considered close contacts, should monitor their health, and will be asked to limit movement outside of the home to the extent possible. Follow the monitoring steps for close contacts listed on the symptom monitoring form.   ? If you have additional questions, contact your local health department or call the epidemiologist on call at [phone] (available 24/7). ? This guidance is subject to change. For the most up-to-date guidance from [facility], please refer to their website: [email or web address]

## 2019-03-11 NOTE — ED Notes (Signed)
Patient verbalizes understanding of discharge instructions. Opportunity for questioning and answers were provided. All questions answered. Prescriptions provided. Armband removed by staff, pt discharged from ED and ambulatory with strong, steady gait

## 2019-03-12 LAB — NOVEL CORONAVIRUS, NAA (HOSP ORDER, SEND-OUT TO REF LAB; TAT 18-24 HRS): SARS-CoV-2, NAA: NOT DETECTED

## 2019-04-27 ENCOUNTER — Other Ambulatory Visit: Payer: Self-pay

## 2019-04-27 ENCOUNTER — Encounter (HOSPITAL_COMMUNITY): Payer: Self-pay

## 2019-04-27 ENCOUNTER — Emergency Department (HOSPITAL_COMMUNITY)
Admission: EM | Admit: 2019-04-27 | Discharge: 2019-04-27 | Disposition: A | Discharge status: Home / Self Care | Payer: Self-pay | Attending: Emergency Medicine | Admitting: Emergency Medicine

## 2019-04-27 DIAGNOSIS — K029 Dental caries, unspecified: Secondary | ICD-10-CM | POA: Insufficient documentation

## 2019-04-27 DIAGNOSIS — Z87891 Personal history of nicotine dependence: Secondary | ICD-10-CM | POA: Insufficient documentation

## 2019-04-27 MED ORDER — IBUPROFEN 600 MG PO TABS: 600.0000 mg | ORAL_TABLET | Freq: Four times a day (QID) | ORAL | 0 refills | Status: DC | PRN

## 2019-04-27 MED ORDER — PENICILLIN V POTASSIUM 250 MG PO TABS
500.0000 mg | ORAL_TABLET | Freq: Once | ORAL | Status: AC
  Administered 2019-04-27: 500 mg via ORAL
  Filled 2019-04-27: qty 2

## 2019-04-27 MED ORDER — PENICILLIN V POTASSIUM 500 MG PO TABS: 500.0000 mg | ORAL_TABLET | Freq: Four times a day (QID) | ORAL | 0 refills | Status: AC

## 2019-04-27 MED ORDER — OXYCODONE-ACETAMINOPHEN 5-325 MG PO TABS
1.0000 | ORAL_TABLET | Freq: Once | ORAL | Status: AC
  Administered 2019-04-27: 1 via ORAL
  Filled 2019-04-27: qty 1

## 2019-04-27 NOTE — Discharge Instructions (Addendum)
Call your dentist as planned on Monday to schedule a time to be seen for further dental care.

## 2019-04-27 NOTE — ED Triage Notes (Signed)
Pt arrives to ED w/ c/o dental pain x 2 days. Pt reports 8/10 pain with some relief from ibuprofen.

## 2019-04-27 NOTE — ED Provider Notes (Signed)
[facility] EMERGENCY DEPARTMENT Provider Note   CSN: [id number] Arrival date & time: 04/27/19  0326     History Chief Complaint  Patient presents with  . Dental Pain    Carl Davila is a 34 y.o. male.  Patient to ED with complaint of left lower rear molar pain that increased in severity over the last 2 days. He reports fever at home with Tmax 102 "sometime in the last 2 days". Some intermittent facial swelling. No difficulty swallowing. He reports his plan is to see his dentist on Monday.   The history is provided by the patient. No language interpreter was used.  Dental Pain Associated symptoms: facial swelling   Associated symptoms: no fever        History reviewed. No pertinent past medical history.  There are no problems to display for this patient.   History reviewed. No pertinent surgical history.     History reviewed. No pertinent family history.  Social History   Tobacco Use  . Smoking status: Former Games developer  . Smokeless tobacco: Never Used  Substance Use Topics  . Alcohol use: No  . Drug use: No    Home Medications Prior to Admission medications   Medication Sig Start Date End Date Taking? Authorizing Provider  acetaminophen (TYLENOL) 500 MG tablet Take 1 tablet (500 mg total) by mouth every 6 (six) hours as needed. 06/04/18   Law, Waylan Boga, PA-C  albuterol (VENTOLIN HFA) 108 (90 Base) MCG/ACT inhaler Inhale 1-2 puffs into the lungs every 6 (six) hours as needed for wheezing or shortness of breath. 03/11/19   Petrucelli, Samantha R, PA-C  azithromycin (ZITHROMAX Z-PAK) 250 MG tablet 2 po day one, then 1 daily x 4 days 05/31/17   Arthor Captain, PA-C  benzonatate (TESSALON) 100 MG capsule Take 1 capsule (100 mg total) by mouth 3 (three) times daily as needed for cough. 03/11/19   Petrucelli, Pleas Koch, PA-C  ciprofloxacin (CILOXAN) 0.3 % ophthalmic ointment Apply 1/2 inch ribbon to eye 3 times per day for 7 days 10/29/18   Hedges,  Tinnie Gens, PA-C  fluticasone (FLONASE) 50 MCG/ACT nasal spray Place 1 spray into both nostrils daily. 03/11/19   Petrucelli, Samantha R, PA-C  ibuprofen (ADVIL) 600 MG tablet Take 1 tablet (600 mg total) by mouth every 6 (six) hours as needed. 04/27/19   Elpidio Anis, PA-C  naproxen (NAPROSYN) 500 MG tablet Take 1 tablet (500 mg total) by mouth 2 (two) times daily. 03/11/19   Petrucelli, Samantha R, PA-C  ondansetron (ZOFRAN) 4 MG tablet Take 1 tablet (4 mg total) by mouth every 6 (six) hours as needed for nausea or vomiting. 06/04/18   Law, Waylan Boga, PA-C  penicillin v potassium (VEETID) 500 MG tablet Take 1 tablet (500 mg total) by mouth 4 (four) times daily for 10 days. 04/27/19 05/07/19  Elpidio Anis, PA-C  promethazine (PHENERGAN) 25 MG tablet Take 1 tablet (25 mg total) every 6 (six) hours as needed by mouth for nausea or vomiting. 12/25/16   Antony Madura, PA-C    Allergies    Patient has no known allergies.  Review of Systems   Review of Systems  Constitutional: Negative for fever.  HENT: Positive for dental problem and facial swelling. Negative for trouble swallowing.   Gastrointestinal: Negative for nausea.    Physical Exam Updated Vital Signs BP 123/83   Pulse 90   Temp 98.1 F (36.7 C) (Oral)   Resp 16   SpO2 100%   Physical  Exam Vitals and nursing note reviewed.  Constitutional:      Appearance: Normal appearance.  HENT:     Mouth/Throat:     Comments: Severe decay to left lower rear molar and to right lower rear molar. No visualized abscess. No facial swelling. Oropharynx is benign. Pulmonary:     Effort: Pulmonary effort is normal.  Neurological:     Mental Status: He is alert and oriented to person, place, and time.     ED Results / Procedures / Treatments   Labs (all labs ordered are listed, but only abnormal results are displayed) Labs Reviewed - No data to display  EKG None  Radiology No results found.  Procedures Procedures (including critical  care time)  Medications Ordered in ED Medications  oxyCODONE-acetaminophen (PERCOCET/ROXICET) 5-325 MG per tablet 1 tablet (1 tablet Oral Given 04/27/19 0523)  penicillin v potassium (VEETID) tablet 500 mg (500 mg Oral Given 04/27/19 9024)    ED Course  I have reviewed the triage vital signs and the nursing notes.  Pertinent labs & imaging results that were available during my care of the patient were reviewed by me and considered in my medical decision making (see chart for details).    MDM Rules/Calculators/A&P                      Patient to ED with dental pain that has progressed in severity x 2 days.   No obvious abscess. There is severe molar decay. Will start on Pen VK. Will provide pain relief here but encouraged continue use of ibuprofen outpatient. Encouraged plan to follow up with his dentist on Monday (in 2 days)_. Final Clinical Impression(s) / ED Diagnoses Final diagnoses:  Dental caries    Rx / DC Orders ED Discharge Orders         Ordered    penicillin v potassium (VEETID) 500 MG tablet  4 times daily     04/27/19 0525    ibuprofen (ADVIL) 600 MG tablet  Every 6 hours PRN     04/27/19 0525           Charlann Lange, PA-C 04/27/19 0533    Ward, Delice Bison, DO 04/27/19 [phone]

## 2019-09-14 ENCOUNTER — Ambulatory Visit: Payer: Self-pay

## 2019-09-17 ENCOUNTER — Ambulatory Visit: Payer: Self-pay | Attending: Internal Medicine

## 2019-09-17 DIAGNOSIS — Z23 Encounter for immunization: Secondary | ICD-10-CM

## 2019-09-17 NOTE — Progress Notes (Signed)
   Covid-19 Vaccination Clinic  Name:  SHERMAINE BRIGHAM    MRN: [id number] DOB: 24-May-1985  09/17/2019  Mr. Pizzuto was observed post Covid-19 immunization for 15 minutes without incident. He was provided with Vaccine Information Sheet and instruction to access the V-Safe system.   Mr. Espin was instructed to call 911 with any severe reactions post vaccine: Marland Kitchen Difficulty breathing  . Swelling of face and throat  . A fast heartbeat  . A bad rash all over body  . Dizziness and weakness   Immunizations Administered    Name Date Dose VIS Date Route   Pfizer COVID-19 Vaccine 09/17/2019  9:25 AM 0.3 mL 04/10/2018 Intramuscular   Manufacturer: [employer], [employer]   Lot: [id number]   NDC: [address]-4

## 2019-09-23 ENCOUNTER — Emergency Department (HOSPITAL_COMMUNITY)
Admission: EM | Admit: 2019-09-23 | Discharge: 2019-09-23 | Disposition: A | Discharge status: Home / Self Care | Payer: HRSA Program | Attending: Emergency Medicine | Admitting: Emergency Medicine

## 2019-09-23 ENCOUNTER — Encounter (HOSPITAL_COMMUNITY): Payer: Self-pay | Admitting: Emergency Medicine

## 2019-09-23 ENCOUNTER — Other Ambulatory Visit: Payer: Self-pay

## 2019-09-23 DIAGNOSIS — Z5321 Procedure and treatment not carried out due to patient leaving prior to being seen by health care provider: Secondary | ICD-10-CM | POA: Insufficient documentation

## 2019-09-23 DIAGNOSIS — Z20822 Contact with and (suspected) exposure to covid-19: Secondary | ICD-10-CM | POA: Diagnosis present

## 2019-09-23 DIAGNOSIS — U071 COVID-19: Secondary | ICD-10-CM | POA: Insufficient documentation

## 2019-09-23 LAB — SARS CORONAVIRUS 2 BY RT PCR (HOSPITAL ORDER, PERFORMED IN ~~LOC~~ HOSPITAL LAB): SARS Coronavirus 2: POSITIVE — AB

## 2019-09-23 NOTE — ED Notes (Signed)
Pt left due to wait time  

## 2019-09-23 NOTE — ED Triage Notes (Signed)
Pt states his whole family has covid, when asked what symptoms you have pt states "all what Covid 19 is".

## 2019-10-08 ENCOUNTER — Ambulatory Visit: Payer: Self-pay

## 2019-10-22 ENCOUNTER — Ambulatory Visit: Payer: Self-pay | Attending: Internal Medicine

## 2019-10-22 DIAGNOSIS — Z23 Encounter for immunization: Secondary | ICD-10-CM

## 2019-10-22 NOTE — Progress Notes (Signed)
   Covid-19 Vaccination Clinic  Name:  [name]    MRN: [id number] DOB: 04-Mar-1985  10/22/2019  [name] was observed post Covid-19 immunization for 15 minutes without incident. He was provided with Vaccine Information Sheet and instruction to access the V-Safe system.   [name] was instructed to call 911 with any severe reactions post vaccine: Marland Kitchen Difficulty breathing  . Swelling of face and throat  . A fast heartbeat  . A bad rash all over body  . Dizziness and weakness   Immunizations Administered    Name Date Dose VIS Date Route   Pfizer COVID-19 Vaccine 10/22/2019  1:19 PM 0.3 mL 04/10/2018 Intramuscular   Manufacturer: [employer], [employer]   Lot: [id number]   NDC: [id number]

## 2019-11-25 IMAGING — CT CT NECK W/ CM
4 of 6 series · 14 of 33 positions shown, 16 images · IV contrast (Omni 300)
Comparison: None.

CLINICAL DATA: Initial evaluation for acute sore throat, evaluate
for peritonsillar abscess.

EXAM:
CT NECK WITH CONTRAST
TECHNIQUE: Multidetector CT imaging of the neck was performed using the
standard protocol following the bolus administration of intravenous
contrast.
CONTRAST:  75mL OMNIPAQUE IOHEXOL 300 MG/ML  SOLN

[Series 3: neck 2.0 st · axial · 0.48mm/px · z∈[-224,-146]mm · 2 of 117 slices shown (1 of 3)]
[im 39/117  bone]
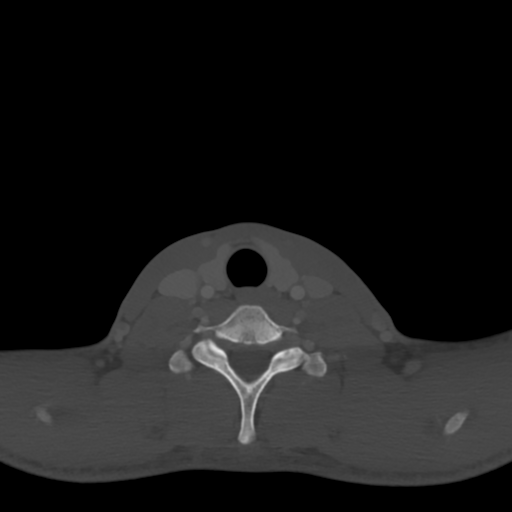
[im 78/117  bone]
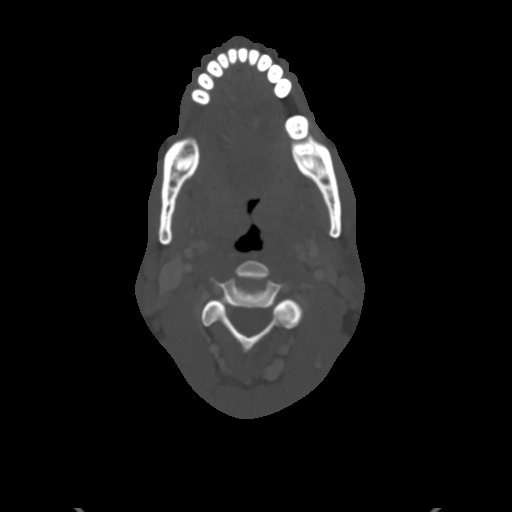

[Series 5: neck 2.0 st · sagittal · 0.51mm/px · 5 of 101 slices shown, 6 images (2 of 3)]
[im 34/101  bone]
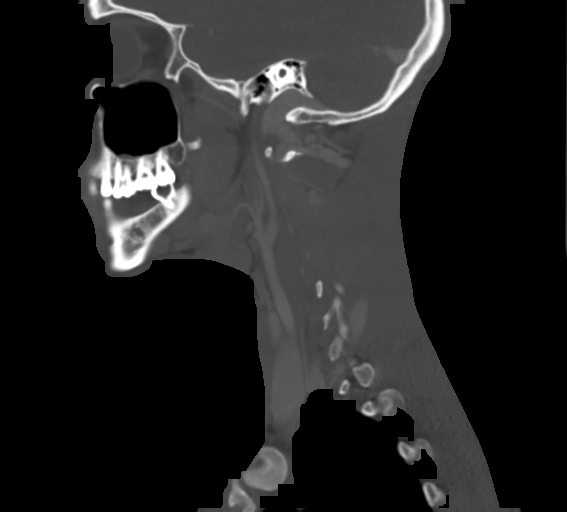
[im 42/101  bone]
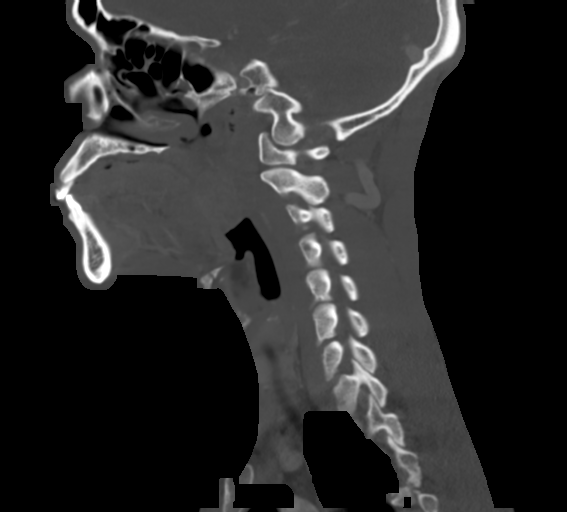
[im 51/101  soft-tissue]
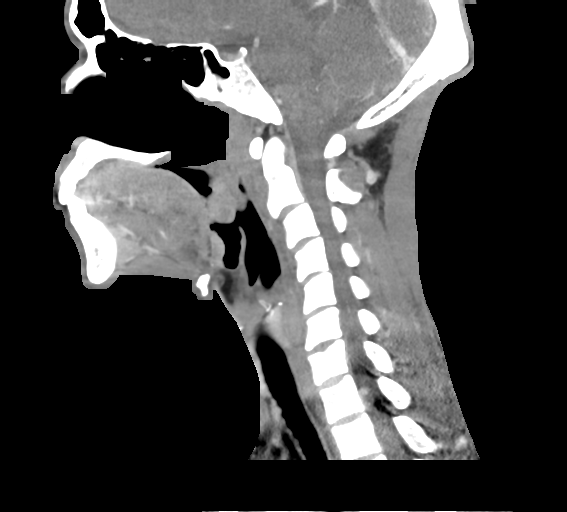
[im 51/101  bone]
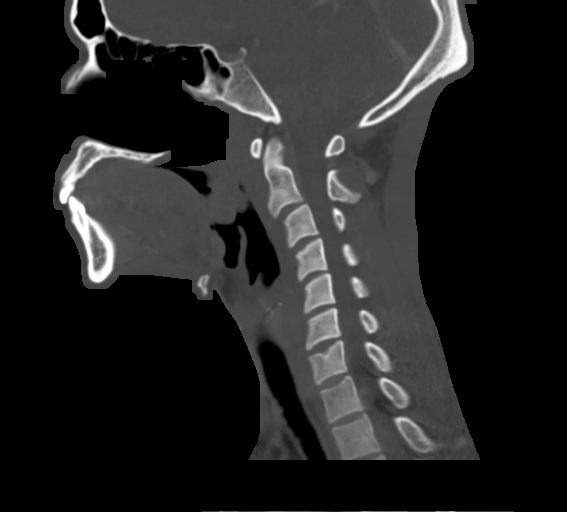
[im 59/101  bone]
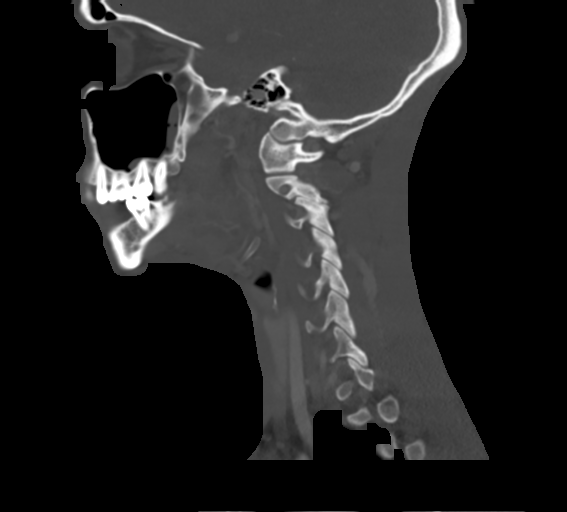
[im 67/101  bone]
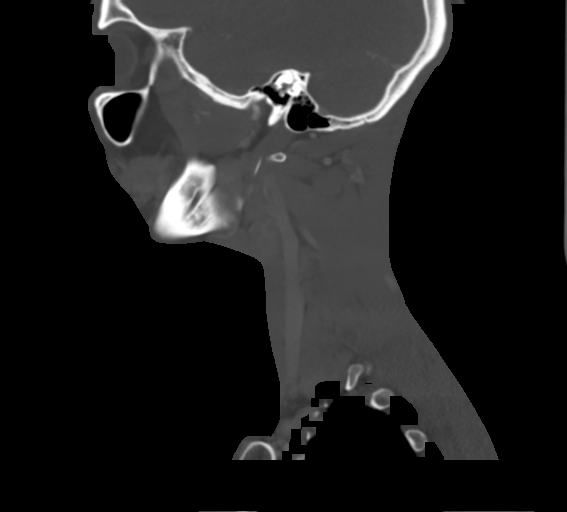

[Series 6: neck 2.0 st · coronal · 0.41mm/px · 3 of 107 slices shown (3 of 3)]
[im 22/107  bone]
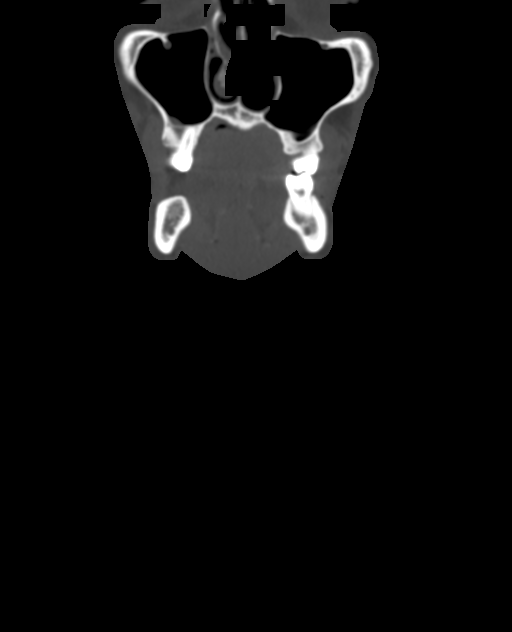
[im 43/107  bone]
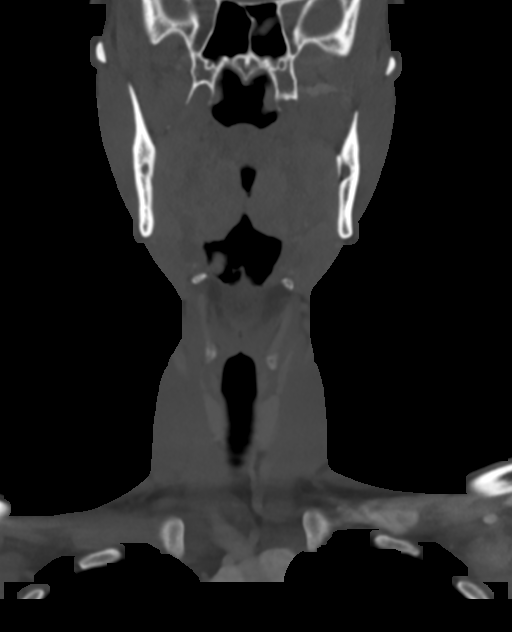
[im 64/107  bone]
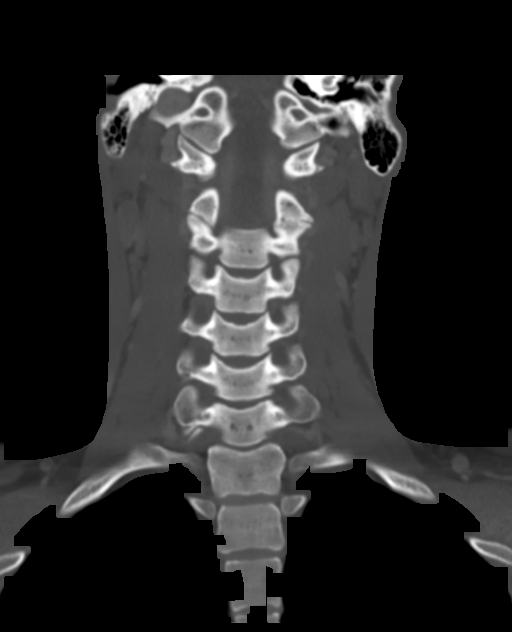

[Series 7: neck 2.0 st orthogonal · axial · 0.39mm/px · z∈[-248,-96]mm · 4 of 128 slices shown, 5 images]
[im 26/128  soft-tissue]
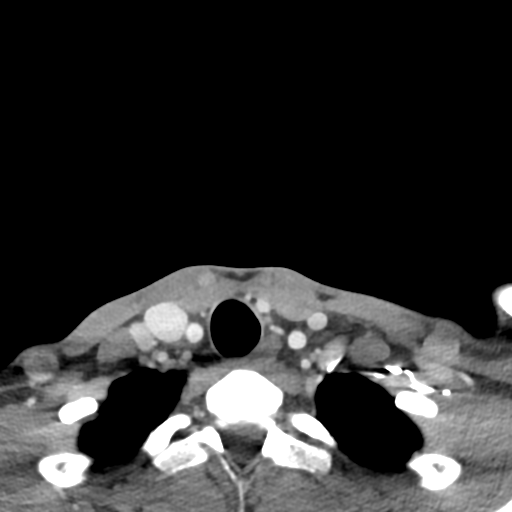
[im 26/128  bone]
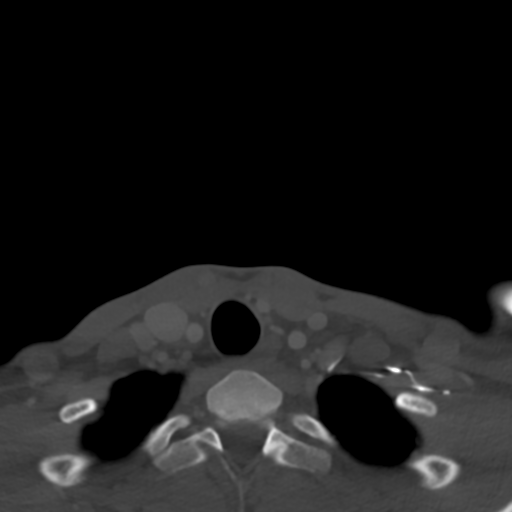
[im 51/128  bone]
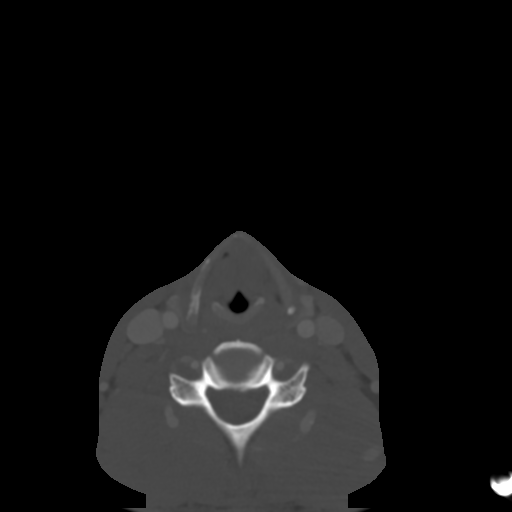
[im 77/128  bone]
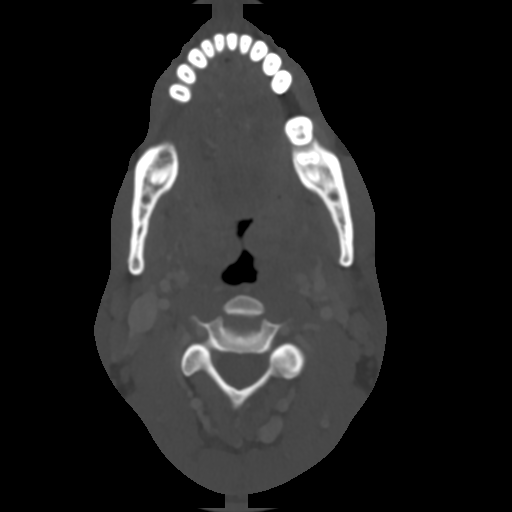
[im 102/128  bone]
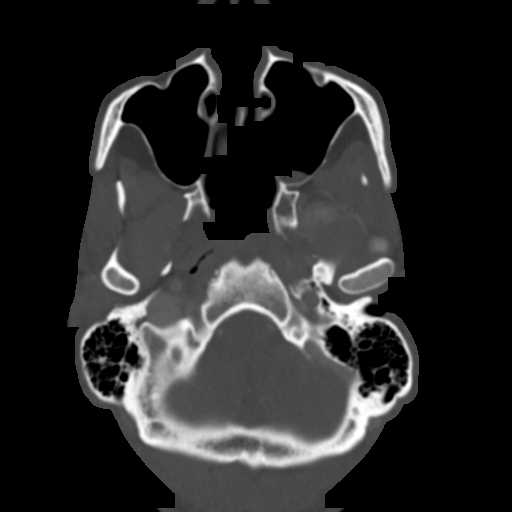

[14 of 33 positions shown; findings below may reference images not displayed]

FINDINGS: Pharynx and larynx: Oral cavity within normal limits without mass
lesion or loculated fluid collection. No acute abnormality about the
dentition. Prominent dental Ayumi at the third mandibular molars
bilaterally. Probable dentigerous cyst at the left maxillary central
incisor.

Palatine tonsils are enlarged, hyperenhancing, and edematous in
appearance, compatible with acute tonsillitis. Scattered ill-defined
hypodensity within the tonsils themselves most consistent with
edema. Associated edema and inflammatory stranding within the
adjacent tonsillar fossa the and parapharyngeal space without
discrete tonsillar or peritonsillar abscess. Mucosal edema within
the adjacent oropharynx, compatible with associated pharyngitis,
slightly worse on the left. Nasopharynx within normal limits. No
retropharyngeal collection. Epiglottis demonstrates no acute
inflammatory changes. Vallecula clear. Remainder of the hypopharynx
and supraglottic larynx within normal limits. Glottis is closed and
not well assessed. Subglottic airway clear.

Salivary glands: Salivary glands including the parotid and
submandibular glands are normal.

Thyroid: Normal.

Lymph nodes: Enlarged bilateral level II lymph nodes measure up to
13 mm on the left and 12 mm on the right, likely reactive. No other
pathologically enlarged lymph nodes identified within the neck.

Vascular: Normal intravascular enhancement seen throughout the neck.

Limited intracranial: Unremarkable.

Visualized orbits: Unremarkable.

Mastoids and visualized paranasal sinuses: Mild layering opacity
within left maxillary sinus. Visualized paranasal sinuses are
otherwise clear. Visualize mastoids and middle ear cavities are well
pneumatized and free of fluid.

Skeleton: No acute osseous abnormality. No discrete lytic or blastic
osseous lesions.

Upper chest: Visualized upper chest demonstrates no acute finding.
Visualized lung apices are clear.

Other: None.
IMPRESSION: 1. Findings consistent with acute tonsillitis/pharyngitis as above.
No discrete tonsillar or peritonsillar abscess identified.
2. Mildly enlarged bilateral level II cervical lymph nodes, likely
reactive.
3. Scattered dental caries as above. Nonemergent outpatient dental
referral recommended.

## 2020-10-20 ENCOUNTER — Emergency Department (HOSPITAL_COMMUNITY)
Admission: EM | Admit: 2020-10-20 | Discharge: 2020-10-20 | Disposition: A | Discharge status: Home / Self Care | Payer: Self-pay | Attending: Emergency Medicine | Admitting: Emergency Medicine

## 2020-10-20 ENCOUNTER — Other Ambulatory Visit: Payer: Self-pay

## 2020-10-20 ENCOUNTER — Encounter (HOSPITAL_COMMUNITY): Payer: Self-pay | Admitting: Emergency Medicine

## 2020-10-20 DIAGNOSIS — Z5321 Procedure and treatment not carried out due to patient leaving prior to being seen by health care provider: Secondary | ICD-10-CM | POA: Insufficient documentation

## 2020-10-20 DIAGNOSIS — R109 Unspecified abdominal pain: Secondary | ICD-10-CM | POA: Insufficient documentation

## 2020-10-20 NOTE — ED Provider Notes (Signed)
Emergency Medicine Provider Triage Evaluation Note  Carl Davila , a 35 y.o. male  was evaluated in triage.  Pt complains of intermittent abdominal pain.  Symptoms started about 1.5 years ago.  States that he will typically get abdominal pain in the morning and will last about the morning.  Reports associated decreased appetite.  He will intermittently have nausea/vomiting but notes frequent diarrhea when his pain occurs.  States that his symptoms all started when he began smoking marijuana but states that he stop smoking marijuana 3 to 4 months ago and this improves his symptoms.  He also notes that he started smoking tobacco around the time that he quit smoking marijuana and states that when he smokes cigarettes he will have worsening diarrhea.  Physical Exam  There were no vitals taken for this visit. Gen:   Awake, no distress   Resp:  Normal effort  MSK:   Moves extremities without difficulty  Other:    Medical Decision Making  Medically screening exam initiated at 5:23 PM.  Appropriate orders placed.  Carl Davila was informed that the remainder of the evaluation will be completed by another provider, this initial triage assessment does not replace that evaluation, and the importance of remaining in the ED until their evaluation is complete.   Placido Sou, PA-C 10/20/20 1724    Terrilee Files, MD 10/21/20 1028

## 2020-10-20 NOTE — ED Triage Notes (Signed)
Per pt, states he has been having abdominal pain for over a year-difficulty eating due to it upsetting his stomach-has not been worked up for symptoms

## 2021-06-15 ENCOUNTER — Emergency Department (HOSPITAL_COMMUNITY): Payer: Self-pay

## 2021-06-15 ENCOUNTER — Other Ambulatory Visit: Payer: Self-pay

## 2021-06-15 ENCOUNTER — Emergency Department (HOSPITAL_COMMUNITY)
Admission: EM | Admit: 2021-06-15 | Discharge: 2021-06-15 | Disposition: A | Discharge status: Home / Self Care | Payer: Self-pay | Attending: Emergency Medicine | Admitting: Emergency Medicine

## 2021-06-15 ENCOUNTER — Encounter (HOSPITAL_COMMUNITY): Payer: Self-pay

## 2021-06-15 DIAGNOSIS — M25522 Pain in left elbow: Secondary | ICD-10-CM | POA: Insufficient documentation

## 2021-06-15 DIAGNOSIS — X509XXA Other and unspecified overexertion or strenuous movements or postures, initial encounter: Secondary | ICD-10-CM | POA: Insufficient documentation

## 2021-06-15 MED ORDER — OXYCODONE HCL 5 MG PO TABS: 5.0000 mg | ORAL_TABLET | Freq: Three times a day (TID) | ORAL | 0 refills | Status: DC | PRN

## 2021-06-15 MED ORDER — IBUPROFEN 600 MG PO TABS: 600.0000 mg | ORAL_TABLET | Freq: Four times a day (QID) | ORAL | 0 refills | Status: AC | PRN

## 2021-06-15 MED ORDER — ACETAMINOPHEN 325 MG PO TABS: 650.0000 mg | ORAL_TABLET | Freq: Four times a day (QID) | ORAL | 0 refills | Status: DC | PRN

## 2021-06-15 MED ORDER — ONDANSETRON 4 MG PO TBDP
4.0000 mg | ORAL_TABLET | Freq: Once | ORAL | Status: AC
  Administered 2021-06-15: 4 mg via ORAL
  Filled 2021-06-15: qty 1

## 2021-06-15 MED ORDER — HYDROCODONE-ACETAMINOPHEN 5-325 MG PO TABS
2.0000 | ORAL_TABLET | Freq: Once | ORAL | Status: AC
  Administered 2021-06-15: 2 via ORAL
  Filled 2021-06-15: qty 2

## 2021-06-15 NOTE — ED Triage Notes (Signed)
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 Patient states he heard and felt a 'Pop" in his left arm. ?Patient unable to move the left arm without having increased pain. ?

## 2021-06-15 NOTE — ED Provider Notes (Signed)
?[facility]-EMERGENCY DEPT ?Provider Note ? ? ?CSN: [id number] ?Arrival date & time: 06/15/21  1351 ? ?  ? ?History ? ?Chief Complaint  ?Patient presents with  ? Arm Injury  ? ? ?Carl Davila is a 36 y.o. male present emerged department pain in his left elbow.  The patient ports yesterday he was stretching his arms backwards, and he felt a "pop" in his elbow and began having pain at that point.  He said it was very significant overnight.  This morning he has not paresthesias in his left hand but these have gradually improved.  He has never injured his elbow or had orthopedic surgery before. ? ?HPI ? ?  ? ?Home Medications ?Prior to Admission medications   ?Medication Sig Start Date End Date Taking? Authorizing Provider  ?acetaminophen (TYLENOL) 325 MG tablet Take 2 tablets (650 mg total) by mouth every 6 (six) hours as needed for up to 30 doses. 06/15/21  Yes Vickie Ponds, Kermit BaloMatthew J, MD  ?ibuprofen (ADVIL) 600 MG tablet Take 1 tablet (600 mg total) by mouth every 6 (six) hours as needed for mild pain or moderate pain. 06/15/21 07/15/21 Yes Joell Buerger, Kermit BaloMatthew J, MD  ?oxyCODONE (ROXICODONE) 5 MG immediate release tablet Take 1 tablet (5 mg total) by mouth every 8 (eight) hours as needed for up to 15 doses for severe pain. 06/15/21  Yes Jamel Holzmann, Kermit BaloMatthew J, MD  ?acetaminophen (TYLENOL) 500 MG tablet Take 1 tablet (500 mg total) by mouth every 6 (six) hours as needed. 06/04/18   Emi HolesLaw, Alexandra M, PA-C  ?albuterol (VENTOLIN HFA) 108 (90 Base) MCG/ACT inhaler Inhale 1-2 puffs into the lungs every 6 (six) hours as needed for wheezing or shortness of breath. 03/11/19   Petrucelli, Samantha R, PA-C  ?azithromycin (ZITHROMAX Z-PAK) 250 MG tablet 2 po day one, then 1 daily x 4 days 05/31/17   Arthor CaptainHarris, Abigail, PA-C  ?benzonatate (TESSALON) 100 MG capsule Take 1 capsule (100 mg total) by mouth 3 (three) times daily as needed for cough. 03/11/19   Petrucelli, Pleas KochSamantha R, PA-C  ?ciprofloxacin (CILOXAN) 0.3 % ophthalmic ointment  Apply 1/2 inch ribbon to eye 3 times per day for 7 days 10/29/18   Hedges, Tinnie GensJeffrey, PA-C  ?fluticasone (FLONASE) 50 MCG/ACT nasal spray Place 1 spray into both nostrils daily. 03/11/19   Petrucelli, Samantha R, PA-C  ?ibuprofen (ADVIL) 600 MG tablet Take 1 tablet (600 mg total) by mouth every 6 (six) hours as needed. 04/27/19   Elpidio AnisUpstill, Shari, PA-C  ?naproxen (NAPROSYN) 500 MG tablet Take 1 tablet (500 mg total) by mouth 2 (two) times daily. 03/11/19   Petrucelli, Pleas KochSamantha R, PA-C  ?ondansetron (ZOFRAN) 4 MG tablet Take 1 tablet (4 mg total) by mouth every 6 (six) hours as needed for nausea or vomiting. 06/04/18   Law, Waylan BogaAlexandra M, PA-C  ?promethazine (PHENERGAN) 25 MG tablet Take 1 tablet (25 mg total) every 6 (six) hours as needed by mouth for nausea or vomiting. 12/25/16   Antony MaduraHumes, Kelly, PA-C  ?   ? ?Allergies    ?Patient has no known allergies.   ? ?Review of Systems   ?Review of Systems ? ?Physical Exam ?Updated Vital Signs ?BP 118/90 (BP Location: Right Arm)   Pulse 87   Temp 98.4 ?F (36.9 ?C) (Oral)   Resp 18   Ht 5\' 8"  (1.727 m)   Wt 64.9 kg   SpO2 95%   BMI 21.74 kg/m?  ?Physical Exam ?Constitutional:   ?   General: He is not  in acute distress. ?HENT:  ?   Head: Normocephalic and atraumatic.  ?Eyes:  ?   Conjunctiva/sclera: Conjunctivae normal.  ?   Pupils: Pupils are equal, round, and reactive to light.  ?Cardiovascular:  ?   Rate and Rhythm: Normal rate and regular rhythm.  ?Pulmonary:  ?   Effort: Pulmonary effort is normal. No respiratory distress.  ?Musculoskeletal:  ?   Comments: Tenderness along the olecranon posterior elbow, patient can fully flex arm but cannot extend arm beyond 90 degrees ?  ?Skin: ?   General: Skin is warm and dry.  ?Neurological:  ?   General: No focal deficit present.  ?   Mental Status: He is alert and oriented to person, place, and time. Mental status is at baseline.  ?   Sensory: No sensory deficit.  ?   Motor: No weakness.  ?Psychiatric:     ?   Mood and Affect: Mood  normal.     ?   Behavior: Behavior normal.  ? ? ?ED Results / Procedures / Treatments   ?Labs ?(all labs ordered are listed, but only abnormal results are displayed) ?Labs Reviewed - No data to display ? ?EKG ?None ? ?Radiology ?DG Elbow Complete Left ? ?Result Date: 06/15/2021 ?CLINICAL DATA:  Left arm and elbow pain.  Hyperextension injury. EXAM: LEFT ELBOW - COMPLETE 3+ VIEW COMPARISON:  None Available. FINDINGS: No elbow joint effusion. Normal bone mineralization. No acute fracture is seen. No dislocation. IMPRESSION: No acute fracture. Electronically Signed   By: Neita Garnet M.D.   On: 06/15/2021 15:30   ? ?Procedures ?Procedures  ? ? ?Medications Ordered in ED ?Medications  ?HYDROcodone-acetaminophen (NORCO/VICODIN) 5-325 MG per tablet 2 tablet (2 tablets Oral Given 06/15/21 1502)  ?ondansetron (ZOFRAN-ODT) disintegrating tablet 4 mg (4 mg Oral Given 06/15/21 1503)  ? ? ?ED Course/ Medical Decision Making/ A&P ?  ?                        ?Medical Decision Making ?Risk ?OTC drugs. ?Prescription drug management. ? ? ?Patient is here with elbow pain after stretching backwards, no significant traumatic mechanism or injury.  Differential would include an occult fracture versus hemarthrosis versus dislocation versus other.  X-rays of the elbow ordered and personally reviewed showing no clear evidence of a fracture or dislocation.  I have a low suspicion for infection.  He is now neurologically intact does not demonstrate any evidence of ulnar nerve injury on his distal nerve exam.  His pain was significantly improved with the Norco given in the ED.  Advised Tylenol, ibuprofen, sling, orthopedic follow-up.  He can give him a few tablets of oxycodone to help sleep.  He verbalized understanding and agreement the plan.  I did explain to him the possibility that he has an occult fracture or another injury that is not immediately visible on the x-rays today, therefore the importance of needing orthopedic follow-up if he  continues having pain 1 week from now. ? ? ? ? ? ? ? ?Final Clinical Impression(s) / ED Diagnoses ?Final diagnoses:  ?Left elbow pain  ? ? ?Rx / DC Orders ?ED Discharge Orders   ? ?      Ordered  ?  ibuprofen (ADVIL) 600 MG tablet  Every 6 hours PRN       ? 06/15/21 1940  ?  acetaminophen (TYLENOL) 325 MG tablet  Every 6 hours PRN       ? 06/15/21 1940  ?  oxyCODONE (ROXICODONE) 5 MG immediate release tablet  Every 8 hours PRN       ?Note to Pharmacy: Digital prescription system in error in [facility]  ? 06/15/21 1944  ? ?  ?  ? ?  ? ? ?  ?Terald Sleeper, MD ?06/15/21 2359 ? ?

## 2021-06-15 NOTE — ED Provider Triage Note (Signed)
Emergency Medicine Provider Triage Evaluation Note ? ?Carl Davila , a 36 y.o. male  was evaluated in triage.  Pt complains of Left arm / elbow pain. At work yesterday he hyperextended the left arm . He hear/ felt something snap and had immediate sever left elbow pain. He has been unable to use the left arm since then. ? ?Review of Systems  ?Positive: Arm inj L ?Negative: numbness ? ?Physical Exam  ?BP 118/90 (BP Location: Right Arm)   Pulse 87   Temp 98.4 ?F (36.9 ?C) (Oral)   Resp 18   SpO2 95%  ?Gen:   Awake, no distress   ?Resp:  Normal effort  ?MSK:   Moves extremities without difficulty  ?Other:  Left arm ttp at the prox forearm ? ?Medical Decision Making  ?Medically screening exam initiated at 2:49 PM.  Appropriate orders placed.  Carl Fogo Slay was informed that the remainder of the evaluation will be completed by another provider, this initial triage assessment does not replace that evaluation, and the importance of remaining in the ED until their evaluation is complete. ? ?Arm injry ?  ?Margarita Mail, PA-C ?06/15/21 1457 ? ?

## 2021-08-27 ENCOUNTER — Emergency Department (HOSPITAL_BASED_OUTPATIENT_CLINIC_OR_DEPARTMENT_OTHER)
Admission: EM | Admit: 2021-08-27 | Discharge: 2021-08-28 | Disposition: A | Discharge status: Home / Self Care | Payer: Self-pay | Attending: Emergency Medicine | Admitting: Emergency Medicine

## 2021-08-27 ENCOUNTER — Other Ambulatory Visit: Payer: Self-pay

## 2021-08-27 ENCOUNTER — Encounter (HOSPITAL_BASED_OUTPATIENT_CLINIC_OR_DEPARTMENT_OTHER): Payer: Self-pay | Admitting: Emergency Medicine

## 2021-08-27 DIAGNOSIS — K0889 Other specified disorders of teeth and supporting structures: Secondary | ICD-10-CM

## 2021-08-27 DIAGNOSIS — J029 Acute pharyngitis, unspecified: Secondary | ICD-10-CM | POA: Insufficient documentation

## 2021-08-27 DIAGNOSIS — K029 Dental caries, unspecified: Secondary | ICD-10-CM | POA: Insufficient documentation

## 2021-08-27 LAB — GROUP A STREP BY PCR: Group A Strep by PCR: NOT DETECTED

## 2021-08-27 MED ORDER — AMOXICILLIN-POT CLAVULANATE 875-125 MG PO TABS
1.0000 | ORAL_TABLET | Freq: Once | ORAL | Status: AC
  Administered 2021-08-27: 1 via ORAL
  Filled 2021-08-27: qty 1

## 2021-08-27 MED ORDER — DEXAMETHASONE SODIUM PHOSPHATE 10 MG/ML IJ SOLN
10.0000 mg | Freq: Once | INTRAMUSCULAR | Status: AC
  Administered 2021-08-27: 10 mg via INTRAMUSCULAR
  Filled 2021-08-27: qty 1

## 2021-08-27 MED ORDER — ACETAMINOPHEN 325 MG PO TABS
650.0000 mg | ORAL_TABLET | Freq: Once | ORAL | Status: AC | PRN
  Administered 2021-08-27: 650 mg via ORAL
  Filled 2021-08-27: qty 2

## 2021-08-27 NOTE — ED Triage Notes (Signed)
Sore, swollen throat started 2 days ago, not able to eat.body aches and chills Swelling noted to face and throat area

## 2021-08-27 NOTE — ED Provider Notes (Signed)
[facility] EMERGENCY DEPT  Provider Note  CSN: [id number] Arrival date & time: 08/27/21 2024  History Chief Complaint  Patient presents with   Sore Throat    Carl Davila is a 36 y.o. male reports 2 days of sore throat, chills and body aches. Pain is worse with swallowing. He also had a bad tooth on the right lower jaw with swelling and pain. Has been told the tooth needs extraction.    Home Medications Prior to Admission medications   Medication Sig Start Date End Date Taking? Authorizing Provider  amoxicillin-clavulanate (AUGMENTIN) 875-125 MG tablet Take 1 tablet by mouth every 12 (twelve) hours for 7 days. 08/28/21 09/04/21 Yes Pollyann Savoy, MD  acetaminophen (TYLENOL) 500 MG tablet Take 1 tablet (500 mg total) by mouth every 6 (six) hours as needed. 06/04/18   Law, Waylan Boga, PA-C  albuterol (VENTOLIN HFA) 108 (90 Base) MCG/ACT inhaler Inhale 1-2 puffs into the lungs every 6 (six) hours as needed for wheezing or shortness of breath. 03/11/19   Petrucelli, Samantha R, PA-C  fluticasone (FLONASE) 50 MCG/ACT nasal spray Place 1 spray into both nostrils daily. 03/11/19   Petrucelli, Samantha R, PA-C  naproxen (NAPROSYN) 500 MG tablet Take 1 tablet (500 mg total) by mouth 2 (two) times daily. 08/28/21   Pollyann Savoy, MD  ondansetron (ZOFRAN) 4 MG tablet Take 1 tablet (4 mg total) by mouth every 6 (six) hours as needed for nausea or vomiting. 06/04/18   Emi Holes, PA-C     Allergies    Patient has no known allergies.   Review of Systems   Review of Systems Please see HPI for pertinent positives and negatives  Physical Exam BP (!) 137/100 (BP Location: Right Arm)   Pulse 72   Temp 98.6 F (37 C) (Oral)   Resp 18   SpO2 100%   Physical Exam Vitals and nursing note reviewed.  Constitutional:      Appearance: Normal appearance.  HENT:     Head: Normocephalic and atraumatic.     Nose: Nose normal.     Mouth/Throat:     Mouth: Mucous  membranes are moist.     Pharynx: Uvula midline. Pharyngeal swelling and posterior oropharyngeal erythema (soft palate) present. No oropharyngeal exudate.     Tonsils: No tonsillar exudate. 1+ on the right. 1+ on the left.     Comments: Large dental caries with gingival erythema and induration to R 3rd molar which is eroded down to gumline Eyes:     Extraocular Movements: Extraocular movements intact.     Conjunctiva/sclera: Conjunctivae normal.  Cardiovascular:     Rate and Rhythm: Normal rate.  Pulmonary:     Effort: Pulmonary effort is normal.     Breath sounds: Normal breath sounds.  Abdominal:     General: Abdomen is flat.     Palpations: Abdomen is soft.     Tenderness: There is no abdominal tenderness.  Musculoskeletal:        General: No swelling. Normal range of motion.     Cervical back: Neck supple.  Lymphadenopathy:     Cervical: No cervical adenopathy.  Skin:    General: Skin is warm and dry.  Neurological:     General: No focal deficit present.     Mental Status: He is alert.  Psychiatric:        Mood and Affect: Mood normal.     ED Results / Procedures / Treatments   EKG None  Procedures Procedures  Medications Ordered in the ED Medications  acetaminophen (TYLENOL) tablet 650 mg (650 mg Oral Given 08/27/21 2113)  dexamethasone (DECADRON) injection 10 mg (10 mg Intramuscular Given 08/27/21 2359)  amoxicillin-clavulanate (AUGMENTIN) 875-125 MG per tablet 1 tablet (1 tablet Oral Given 08/27/21 2358)    Initial Impression and Plan  Patient here with sore throat and tooth ache. Strep is neg. He does not have evidence of peritonsillar abscess, no hot potato voice or trismus. Not febrile here. Will give a dose of decadron to help with swelling. Also has evidence of dental infection. Rx Augmentin for same. Recommend he follow up with oral surgeon for extraction when his infection is improved.   ED Course       MDM Rules/Calculators/A&P Medical Decision  Making Problems Addressed: Sore throat: acute illness or injury Toothache: acute illness or injury  Amount and/or Complexity of Data Reviewed Labs: ordered. Decision-making details documented in ED Course.  Risk OTC drugs. Prescription drug management.    Final Clinical Impression(s) / ED Diagnoses Final diagnoses:  Sore throat  Toothache    Rx / DC Orders ED Discharge Orders          Ordered    naproxen (NAPROSYN) 500 MG tablet  2 times daily        08/28/21 0001    amoxicillin-clavulanate (AUGMENTIN) 875-125 MG tablet  Every 12 hours        08/28/21 0001             Pollyann Savoy, MD 08/28/21 0002

## 2021-08-28 MED ORDER — NAPROXEN 500 MG PO TABS: 500.0000 mg | ORAL_TABLET | Freq: Two times a day (BID) | ORAL | 0 refills | Status: DC

## 2021-08-28 MED ORDER — AMOXICILLIN-POT CLAVULANATE 875-125 MG PO TABS: 1.0000 | ORAL_TABLET | Freq: Two times a day (BID) | ORAL | 0 refills | Status: AC

## 2021-08-28 MED ORDER — NAPROXEN 500 MG PO TABS: 500.0000 mg | ORAL_TABLET | Freq: Two times a day (BID) | ORAL | 0 refills | Status: AC

## 2021-08-30 ENCOUNTER — Other Ambulatory Visit: Payer: Self-pay

## 2021-08-30 ENCOUNTER — Encounter (HOSPITAL_COMMUNITY): Payer: Self-pay | Admitting: Emergency Medicine

## 2021-08-30 ENCOUNTER — Emergency Department (HOSPITAL_COMMUNITY)
Admission: EM | Admit: 2021-08-30 | Discharge: 2021-08-30 | Disposition: A | Discharge status: Home / Self Care | Payer: Self-pay | Attending: Emergency Medicine | Admitting: Emergency Medicine

## 2021-08-30 ENCOUNTER — Encounter (HOSPITAL_BASED_OUTPATIENT_CLINIC_OR_DEPARTMENT_OTHER): Payer: Self-pay | Admitting: Obstetrics and Gynecology

## 2021-08-30 ENCOUNTER — Emergency Department (HOSPITAL_BASED_OUTPATIENT_CLINIC_OR_DEPARTMENT_OTHER)
Admission: EM | Admit: 2021-08-30 | Discharge: 2021-08-30 | Disposition: A | Discharge status: Home / Self Care | Payer: Self-pay | Attending: Emergency Medicine | Admitting: Emergency Medicine

## 2021-08-30 ENCOUNTER — Emergency Department (HOSPITAL_BASED_OUTPATIENT_CLINIC_OR_DEPARTMENT_OTHER): Payer: Self-pay

## 2021-08-30 DIAGNOSIS — Z5321 Procedure and treatment not carried out due to patient leaving prior to being seen by health care provider: Secondary | ICD-10-CM | POA: Insufficient documentation

## 2021-08-30 DIAGNOSIS — K122 Cellulitis and abscess of mouth: Secondary | ICD-10-CM | POA: Insufficient documentation

## 2021-08-30 DIAGNOSIS — D72829 Elevated white blood cell count, unspecified: Secondary | ICD-10-CM | POA: Insufficient documentation

## 2021-08-30 DIAGNOSIS — R220 Localized swelling, mass and lump, head: Secondary | ICD-10-CM | POA: Insufficient documentation

## 2021-08-30 DIAGNOSIS — E876 Hypokalemia: Secondary | ICD-10-CM | POA: Insufficient documentation

## 2021-08-30 DIAGNOSIS — J029 Acute pharyngitis, unspecified: Secondary | ICD-10-CM | POA: Insufficient documentation

## 2021-08-30 LAB — CBC WITH DIFFERENTIAL/PLATELET
Abs Immature Granulocytes: 0.02 10*3/uL (ref 0.00–0.07)
Basophils Absolute: 0.1 10*3/uL (ref 0.0–0.1)
Basophils Relative: 1 %
Eosinophils Absolute: 0.2 10*3/uL (ref 0.0–0.5)
Eosinophils Relative: 2 %
HCT: 45 % (ref 39.0–52.0)
Hemoglobin: 15.2 g/dL (ref 13.0–17.0)
Immature Granulocytes: 0 %
Lymphocytes Relative: 14 %
Lymphs Abs: 1.5 10*3/uL (ref 0.7–4.0)
MCH: 29.6 pg (ref 26.0–34.0)
MCHC: 33.8 g/dL (ref 30.0–36.0)
MCV: 87.5 fL (ref 80.0–100.0)
Monocytes Absolute: 1 10*3/uL (ref 0.1–1.0)
Monocytes Relative: 10 %
Neutro Abs: 8.1 10*3/uL — ABNORMAL HIGH (ref 1.7–7.7)
Neutrophils Relative %: 73 %
Platelets: 393 10*3/uL (ref 150–400)
RBC: 5.14 MIL/uL (ref 4.22–5.81)
RDW: 13.2 % (ref 11.5–15.5)
WBC: 10.8 10*3/uL — ABNORMAL HIGH (ref 4.0–10.5)
nRBC: 0 % (ref 0.0–0.2)

## 2021-08-30 LAB — BASIC METABOLIC PANEL
Anion gap: 10 (ref 5–15)
BUN: 9 mg/dL (ref 6–20)
CO2: 21 mmol/L — ABNORMAL LOW (ref 22–32)
Calcium: 9 mg/dL (ref 8.9–10.3)
Chloride: 108 mmol/L (ref 98–111)
Creatinine, Ser: 0.97 mg/dL (ref 0.61–1.24)
GFR, Estimated: >60 mL/min (ref >60)
Glucose, Bld: 107 mg/dL — ABNORMAL HIGH (ref 70–99)
Potassium: 3.3 mmol/L — ABNORMAL LOW (ref 3.5–5.1)
Sodium: 139 mmol/L (ref 135–145)

## 2021-08-30 LAB — TROPONIN I (HIGH SENSITIVITY)
Troponin I (High Sensitivity): 3 ng/L (ref <18)
Troponin I (High Sensitivity): 3 ng/L (ref <18)

## 2021-08-30 MED ORDER — SODIUM CHLORIDE 0.9 % IV BOLUS
500.0000 mL | Freq: Once | INTRAVENOUS | Status: AC
  Administered 2021-08-30: 500 mL via INTRAVENOUS

## 2021-08-30 MED ORDER — OXYCODONE-ACETAMINOPHEN 5-325 MG PO TABS
1.0000 | ORAL_TABLET | Freq: Once | ORAL | Status: AC
  Administered 2021-08-30: 1 via ORAL
  Filled 2021-08-30: qty 1

## 2021-08-30 MED ORDER — SODIUM CHLORIDE 0.9 % IV SOLN
3.0000 g | Freq: Four times a day (QID) | INTRAVENOUS | Status: DC
  Administered 2021-08-30: 3 g via INTRAVENOUS

## 2021-08-30 MED ORDER — IOHEXOL 300 MG/ML  SOLN
100.0000 mL | Freq: Once | INTRAMUSCULAR | Status: AC | PRN
  Administered 2021-08-30: 75 mL via INTRAVENOUS

## 2021-08-30 NOTE — ED Notes (Signed)
Paged Oral Surgery at 10:01

## 2021-08-30 NOTE — ED Notes (Signed)
Called [facility] to assist in obtaining oral surgeon consult. They reported they has re-paged and if we do not hear back shortly to try calling the office number listed. MD aware

## 2021-08-30 NOTE — Discharge Instructions (Signed)
You were seen in the emergency room today with swelling to the jaw.  I have arranged for you to see an oral surgeon today at 3:15 PM.  Please go to the office listed above and check in for your appointment.  They will discuss treatment and prescribe any additional antibiotics that would be necessary.  Please do not eat or drink prior to this appointment.

## 2021-08-30 NOTE — ED Triage Notes (Signed)
Patient reports to the ER for throat and lower chin swelling that he feels is going into his chest somewhat. Patient reports he has been on penicillin but it has started to swell again. Airway intact.

## 2021-08-30 NOTE — ED Notes (Signed)
Pt did not want to stay and wanted to go to different hospital.

## 2021-08-30 NOTE — ED Triage Notes (Addendum)
Patient reports throat and lower chin swelling with pain onset Thursday , respirations unlabored , denies fever . Airway intact .

## 2021-08-30 NOTE — ED Provider Notes (Signed)
Emergency Department Provider Note   I have reviewed the triage vital signs and the nursing notes.   HISTORY  Chief Complaint Facial Pain   HPI Carl Davila is a 36 y.o. male returns to the emergency department for evaluation of swelling and pain to the mouth just below the mandible on the right.  Patient was seen in the ED 2 days prior and started on Augmentin.  He was noted to have some swelling at that time and given Decadron but states swelling has not reduced and in fact is increased.  He is having pain with swallowing and some drooling this morning.  He feels at the floor of his mouth is swollen and firm.  He spoke with his dentist by phone who advised to present to the ED for evaluation.  He initially went to [facility] but the wait was very Estell Puccini and after having lab work drawn, came here for more urgent evaluation. No SOB but some with laying flat. No vomiting or fever.  Patient tells me that the pain in that area radiates to his right neck and some down into his chest.  He states his chest discomfort is similar to that he is experiencing in his face.    History reviewed. No pertinent past medical history.  Review of Systems  Constitutional: No fever/chills Eyes: No visual changes. ENT: No sore throat. Positive right mandible swelling/dental pain.  Cardiovascular: Positive chest pain. Respiratory: Denies shortness of breath. Gastrointestinal: No abdominal pain.  No nausea, no vomiting.  No diarrhea.  No constipation. Genitourinary: Negative for dysuria. Musculoskeletal: Negative for back pain. Skin: Negative for rash. Neurological: Negative for headaches, focal weakness or numbness.   ____________________________________________   PHYSICAL EXAM:  VITAL SIGNS: ED Triage Vitals  Enc Vitals Group     BP 08/30/21 0804 (!) 137/101     Pulse Rate 08/30/21 0804 78     Resp 08/30/21 0804 14     Temp 08/30/21 0804 98.4 F (36.9 C)     Temp Source 08/30/21 0804 Oral      SpO2 08/30/21 0804 99 %   Constitutional: Alert and oriented. Well appearing and in no acute distress. Eyes: Conjunctivae are normal.  Head: Atraumatic. Nose: No congestion/rhinnorhea. Mouth/Throat: Mucous membranes are moist.  Oropharynx non-erythematous. No visible dental abscess or trismus. Clear voice but firm edema to the submandibular space on the right.  Neck: No stridor.   Cardiovascular: Normal rate, regular rhythm. Good peripheral circulation. Grossly normal heart sounds.   Respiratory: Normal respiratory effort.  No retractions. Lungs CTAB. Gastrointestinal: No distention.  Musculoskeletal: No gross deformities of extremities. Neurologic:  Normal speech and language.   ____________________________________________   LABS (all labs ordered are listed, but only abnormal results are displayed)  Labs Reviewed  CULTURE, BLOOD (ROUTINE X 2)  CULTURE, BLOOD (ROUTINE X 2)  TROPONIN I (HIGH SENSITIVITY)  TROPONIN I (HIGH SENSITIVITY)   ____________________________________________  EKG  Rate 67.  PR 218.  QRS 96.  Sinus rhythm with T wave inversions in the precordial leads.  First-degree AV block noted. Changed from 2021.   ____________________________________________  RADIOLOGY  CT Soft Tissue Neck W Contrast  Result Date: 08/30/2021 CLINICAL DATA:  Soft tissue swelling, infection suspected, neck xray done [name] concern. Left mandible pain/swelling EXAM: CT NECK WITH CONTRAST TECHNIQUE: Multidetector CT imaging of the neck was performed using the standard protocol following the bolus administration of intravenous contrast. RADIATION DOSE REDUCTION: This exam was performed according to the departmental  dose-optimization program which includes automated exposure control, adjustment of the mA and/or kV according to patient size and/or use of iterative reconstruction technique. CONTRAST:  58mL OMNIPAQUE IOHEXOL 300 MG/ML  SOLN COMPARISON:  2019 FINDINGS: Pharynx and  larynx: Soft tissue swelling with fluid in the right sublingual space. Inflammatory changes are also present in the submandibular space as well as the overlying subcutaneous soft tissues along the mandible. No abscess pharynx and larynx are unremarkable. Patent airway. Salivary glands: Parotid and submandibular glands are unremarkable. Thyroid: Normal. Lymph nodes: Few top-normal, likely reactive level 2 nodes. Asymmetric likely reactive right submandibular nodes. Vascular: Major neck vessels are patent. Limited intracranial: No abnormal enhancement. Visualized orbits: Unremarkable. Mastoids and visualized paranasal sinuses: Mastoids are clear. Minor paranasal sinus mucosal thickening. Skeleton: Mild degenerative changes of cervical spine. Upper chest: Included upper lungs are clear. Other: Bilateral markedly decayed third mandibular molars. IMPRESSION: Right submandibular inflammatory changes. There is fluid within the sublingual space that may reflect developing abscess. Electronically Signed   By: Guadlupe Spanish M.D.   On: 08/30/2021 09:29    ____________________________________________   PROCEDURES  Procedure(s) performed:   Procedures  CRITICAL CARE Performed by: Carl Davila Total critical care time: 35 minutes Critical care time was exclusive of separately billable procedures and treating other patients. Critical care was necessary to treat or prevent imminent or life-threatening deterioration. Critical care was time spent personally by me on the following activities: development of treatment Davila with patient and/or surrogate as well as nursing, discussions with consultants, evaluation of patient's response to treatment, examination of patient, obtaining history from patient or surrogate, ordering and performing treatments and interventions, ordering and review of laboratory studies, ordering and review of radiographic studies, pulse oximetry and re-evaluation of patient's  condition.  Carl Bene, MD Emergency Medicine  ____________________________________________   INITIAL IMPRESSION / ASSESSMENT AND Davila / ED COURSE  Pertinent labs & imaging results that were available during my care of the patient were reviewed by me and considered in my medical decision making (see chart for details).   This patient is Presenting for Evaluation of face pain, which does require a range of treatment options, and is a complaint that involves a high risk of morbidity and mortality.  The Differential Diagnoses include [name], dental abscess, deep space neck infection, ACS, PE.  Critical Interventions-    Medications  sodium chloride 0.9 % bolus 500 mL (0 mLs Intravenous Stopped 08/30/21 1154)  iohexol (OMNIPAQUE) 300 MG/ML solution 100 mL (75 mLs Intravenous Contrast Given 08/30/21 0905)  oxyCODONE-acetaminophen (PERCOCET/ROXICET) 5-325 MG per tablet 1 tablet (1 tablet Oral Given 08/30/21 1248)    Reassessment after intervention: Symptoms improved. No rapid expansion.   I did obtain Additional Historical Information from wife at bedside.  I decided to review pertinent External Data, and in summary ED note from 08/27/21 reviewed.   Clinical Laboratory Tests Ordered, included lab work from the [facility] emergency department drawn at 6:52 AM.  Mild leukocytosis to 10.8 without anemia.  No acute kidney injury.  Mild hypokalemia 3.3.   Radiologic Tests Ordered, included CT soft tissue neck. I independently interpreted the images and agree with radiology interpretation.   Cardiac Monitor Tracing which shows NSR.   Social Determinants of Health Risk patient is a former smoker.   Consult complete with Dr. Chales Salmon with [facility]. Discussed case and CT findings. He would like to see the patient in his office this afternoon.   Medical Decision Making: Summary:  Patient presents emergency  department with swelling to the mandible on the right.  I do have some clinical  concern for [name] given the location of swelling. No indication at this time for airway mgmt. Will send culture and start Unasyn. CP noted seems to be referred based on history but EKG is abnormal. Will send troponin.   Reevaluation with update and discussion with patient.  We discussed Davila for discharge and follow-up with OM[facility] today at 3:15 PM for an appointment.  Patient will remain n.p.o. No evidence of airway compromise or rapid expansion of swelling to the face.  Patient and wife in agreement with Davila and verbalized understanding.   Considered admission but I was able to secure follow-up in 2 hours with oral maxillofacial surgery in the office for evaluation and treatment.  Patient given dose of Unasyn along with strict ED return precautions.  Disposition: discharge  ____________________________________________  FINAL CLINICAL IMPRESSION(S) / ED DIAGNOSES  Final diagnoses:  Sublingual space abscess    Note:  This document was prepared using Dragon voice recognition software and may include unintentional dictation errors.  Carl Quinton, MD, [facility] Emergency Medicine    Carl Davila, Carl Olds, MD 08/31/21 [id number]

## 2021-09-04 LAB — CULTURE, BLOOD (ROUTINE X 2)
Culture: NO GROWTH
Culture: NO GROWTH
Special Requests: ADEQUATE
Special Requests: ADEQUATE
